# Patient Record
Sex: Male | Born: 1945 | Race: White | Hispanic: No | Marital: Married | Smoking: Former smoker
Health system: Southern US, Community
[De-identification: ages and names within clinical notes are randomized; demographics above are authoritative.]

## PROBLEM LIST (undated history)

## (undated) DIAGNOSIS — T884XXA Failed or difficult intubation, initial encounter: Secondary | ICD-10-CM

## (undated) DIAGNOSIS — Z87898 Personal history of other specified conditions: Secondary | ICD-10-CM

## (undated) DIAGNOSIS — N2889 Other specified disorders of kidney and ureter: Secondary | ICD-10-CM

## (undated) DIAGNOSIS — Z87442 Personal history of urinary calculi: Secondary | ICD-10-CM

## (undated) DIAGNOSIS — T8859XA Other complications of anesthesia, initial encounter: Secondary | ICD-10-CM

## (undated) DIAGNOSIS — M1990 Unspecified osteoarthritis, unspecified site: Secondary | ICD-10-CM

## (undated) DIAGNOSIS — D759 Disease of blood and blood-forming organs, unspecified: Secondary | ICD-10-CM

## (undated) DIAGNOSIS — R519 Headache, unspecified: Secondary | ICD-10-CM

## (undated) DIAGNOSIS — E039 Hypothyroidism, unspecified: Secondary | ICD-10-CM

## (undated) DIAGNOSIS — F419 Anxiety disorder, unspecified: Secondary | ICD-10-CM

## (undated) DIAGNOSIS — G4730 Sleep apnea, unspecified: Secondary | ICD-10-CM

## (undated) DIAGNOSIS — K5790 Diverticulosis of intestine, part unspecified, without perforation or abscess without bleeding: Secondary | ICD-10-CM

## (undated) DIAGNOSIS — C61 Malignant neoplasm of prostate: Secondary | ICD-10-CM

## (undated) DIAGNOSIS — T4145XA Adverse effect of unspecified anesthetic, initial encounter: Secondary | ICD-10-CM

## (undated) DIAGNOSIS — L121 Cicatricial pemphigoid: Secondary | ICD-10-CM

## (undated) DIAGNOSIS — J45909 Unspecified asthma, uncomplicated: Secondary | ICD-10-CM

## (undated) DIAGNOSIS — F319 Bipolar disorder, unspecified: Secondary | ICD-10-CM

## (undated) HISTORY — PX: EYE SURGERY: SHX253

## (undated) HISTORY — PX: TONSILLECTOMY: SUR1361

## (undated) HISTORY — PX: OTHER SURGICAL HISTORY: SHX169

## (undated) HISTORY — PX: NECK SURGERY: SHX720

## (undated) HISTORY — PX: HERNIA REPAIR: SHX51

## (undated) HISTORY — PX: WISDOM TOOTH EXTRACTION: SHX21

---

## 2002-05-15 ENCOUNTER — Encounter: Payer: Self-pay | Admitting: Family Medicine

## 2002-05-15 ENCOUNTER — Ambulatory Visit (HOSPITAL_COMMUNITY): Admission: RE | Admit: 2002-05-15 | Discharge: 2002-05-15 | Payer: Self-pay | Admitting: Family Medicine

## 2002-09-13 ENCOUNTER — Ambulatory Visit (HOSPITAL_BASED_OUTPATIENT_CLINIC_OR_DEPARTMENT_OTHER): Admission: RE | Admit: 2002-09-13 | Discharge: 2002-09-13 | Payer: Self-pay | Admitting: *Deleted

## 2003-05-23 ENCOUNTER — Ambulatory Visit (HOSPITAL_BASED_OUTPATIENT_CLINIC_OR_DEPARTMENT_OTHER): Admission: RE | Admit: 2003-05-23 | Discharge: 2003-05-23 | Payer: Self-pay | Admitting: General Surgery

## 2003-05-23 ENCOUNTER — Ambulatory Visit (HOSPITAL_COMMUNITY): Admission: RE | Admit: 2003-05-23 | Discharge: 2003-05-23 | Payer: Self-pay | Admitting: General Surgery

## 2003-08-18 ENCOUNTER — Ambulatory Visit (HOSPITAL_COMMUNITY): Admission: RE | Admit: 2003-08-18 | Discharge: 2003-08-19 | Payer: Self-pay | Admitting: Neurosurgery

## 2003-08-18 HISTORY — PX: NECK SURGERY: SHX720

## 2003-08-31 ENCOUNTER — Ambulatory Visit (HOSPITAL_COMMUNITY): Admission: RE | Admit: 2003-08-31 | Discharge: 2003-08-31 | Payer: Self-pay | Admitting: Oncology

## 2006-02-25 ENCOUNTER — Ambulatory Visit (HOSPITAL_COMMUNITY): Admission: RE | Admit: 2006-02-25 | Discharge: 2006-02-26 | Payer: Self-pay | Admitting: Otolaryngology

## 2007-02-23 ENCOUNTER — Ambulatory Visit (HOSPITAL_COMMUNITY): Admission: RE | Admit: 2007-02-23 | Discharge: 2007-02-23 | Payer: Self-pay | Admitting: Specialist

## 2009-04-11 ENCOUNTER — Ambulatory Visit: Admission: RE | Admit: 2009-04-11 | Discharge: 2009-05-09 | Payer: Self-pay | Admitting: Radiation Oncology

## 2009-04-23 ENCOUNTER — Encounter: Admission: RE | Admit: 2009-04-23 | Discharge: 2009-04-23 | Payer: Self-pay | Admitting: Urology

## 2009-05-12 DIAGNOSIS — C61 Malignant neoplasm of prostate: Secondary | ICD-10-CM

## 2009-05-12 HISTORY — DX: Malignant neoplasm of prostate: C61

## 2009-05-28 ENCOUNTER — Ambulatory Visit (HOSPITAL_BASED_OUTPATIENT_CLINIC_OR_DEPARTMENT_OTHER): Admission: RE | Admit: 2009-05-28 | Discharge: 2009-05-28 | Payer: Self-pay | Admitting: Urology

## 2009-06-15 ENCOUNTER — Ambulatory Visit: Admission: RE | Admit: 2009-06-15 | Discharge: 2009-09-13 | Payer: Self-pay | Admitting: Radiation Oncology

## 2009-09-18 ENCOUNTER — Ambulatory Visit: Admission: RE | Admit: 2009-09-18 | Discharge: 2009-09-23 | Payer: Self-pay | Admitting: Radiation Oncology

## 2010-02-18 ENCOUNTER — Encounter: Admission: RE | Admit: 2010-02-18 | Discharge: 2010-02-18 | Payer: Self-pay | Admitting: General Surgery

## 2010-07-28 LAB — CBC
HCT: 48.7 % (ref 39.0–52.0)
Hemoglobin: 16.2 g/dL (ref 13.0–17.0)
MCHC: 33.2 g/dL (ref 30.0–36.0)
MCV: 93.8 fL (ref 78.0–100.0)
Platelets: 287 10*3/uL (ref 150–400)
RBC: 5.2 MIL/uL (ref 4.22–5.81)
RDW: 14.3 % (ref 11.5–15.5)
WBC: 7.5 10*3/uL (ref 4.0–10.5)

## 2010-07-28 LAB — APTT: aPTT: 35 seconds (ref 24–37)

## 2010-07-28 LAB — COMPREHENSIVE METABOLIC PANEL
ALT: 26 U/L (ref 0–53)
AST: 24 U/L (ref 0–37)
Albumin: 3.8 g/dL (ref 3.5–5.2)
Alkaline Phosphatase: 80 U/L (ref 39–117)
BUN: 12 mg/dL (ref 6–23)
CO2: 27 mEq/L (ref 19–32)
Calcium: 9.3 mg/dL (ref 8.4–10.5)
Chloride: 107 mEq/L (ref 96–112)
Creatinine, Ser: 0.97 mg/dL (ref 0.4–1.5)
GFR calc Af Amer: >60 mL/min (ref >60)
GFR calc non Af Amer: >60 mL/min (ref >60)
Glucose, Bld: 95 mg/dL (ref 70–99)
Potassium: 4.2 mEq/L (ref 3.5–5.1)
Sodium: 139 mEq/L (ref 135–145)
Total Bilirubin: 0.7 mg/dL (ref 0.3–1.2)
Total Protein: 6.5 g/dL (ref 6.0–8.3)

## 2010-07-28 LAB — PROTIME-INR
INR: 0.99 (ref 0.00–1.49)
Prothrombin Time: 13 seconds (ref 11.6–15.2)

## 2010-09-24 NOTE — Procedures (Signed)
EEG NUMBER:  [id number].   HISTORY:  This is a 65 year old with episodes of visual and auditory  hallucinations.  The patient is having an EEG done to evaluate for  seizure activity.   REFERRING PHYSICIAN:  Dr. Neale Burly   PROCEDURE:  Routine EEG.   TECHNICAL DESCRIPTION:  Throughout this routine EEG there is a posterior  dominant rhythm of 9-10 Hz activity at 10-20 microvolts.  The background  activity is symmetric, mostly comprised of alpha range activity at 10-25  microvolts.   With photic stimulation there is mild symmetric photic driving response  noted.   Hyperventilation does not produce any significant abnormalities.   The patient does not go to sleep during this recording.  Throughout this  record there is no evidence of electrographic seizures or interictal  discharge activity.   The EKG tracing shows a heart rhythm around 70 beats per minute   IMPRESSION:  This routine EEG is within normal limits in the awake  state.      Bevelyn Buckles. Nash Shearer, M.D.  Electronically Signed     ZOX:WRUE  D:  02/23/2007 12:11:10  T:  02/24/2007 08:48:13  Job #:  [id number]   cc:   Santiago Glad  Fax: [phone]

## 2010-09-27 NOTE — Discharge Summary (Signed)
NAME:  Eric Crosby, BRADDY NO.:  [id number]   MEDICAL RECORD NO.:  [id number]          PATIENT TYPE:  OIB   LOCATION:  2550                         FACILITY:  [facility]   PHYSICIAN:  Hermelinda Medicus, M.D.   DATE OF BIRTH:  [birth year]   DATE OF ADMISSION:  02/25/2006  DATE OF DISCHARGE:  02/26/2006                                 DISCHARGE SUMMARY   HISTORY:  This patient is a 65 year old male who has had considerable  snoring issues.  His sleep numbers showed an RDI of 7.4, lowest O2 of 84%.  He, however, is having considerable snoring.  He had some nasal trauma and  has considerable nasal obstruction with a severe septal deviation.  He now  enters for a septal reconstruction, turbinate reduction, and a laser-  assisted uvulopalatoplasty.   PAST MEDICAL HISTORY:  Unremarkable.   ALLERGIES:  SULFA.   SOCIAL HISTORY:  He drinks very occasionally, one time a month, and still  smokes.  He smoked for 20 years, but stopped in 1987.  He states he smoked  up to 3 packs a day at one time.   PAST SURGICAL HISTORY:  T and A as a child.  He had a hernia repair in 2005.  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   PHYSICAL EXAMINATION:  VITAL SIGNS:  Remarkable for blood pressure of  108/77, height 73 inches, he weighs 101 kg, his heart rate is 74.  HEENT:  Tympanic membranes are clear.  Severe septal deviation to the right  and his turbinate is very large, blocking his nose.  His oral cavity is also  small and narrow.  His pallet is low and uvula is large.  Free of any  thyromegaly, cervical adenopathy, or mass.  The larynx is clear.  True  cords, false cords, epiglottis, and base of tongue are clear.  True cord  mobility, gag reflex, tongue mobility, EOMs, facial nerves are all  symmetrical.  CHEST:  Clear.  No rales, rhonchi, or wheezes.  CARDIOVASCULAR:  Unremarkable.  His cardiology evaluation shows a  normal  EKG.   STATUS:  Excellent.  His health history is excellent.   INITIAL DIAGNOSES:  1. Snoring with septal deviation, turbinate hypertrophy, nasal      obstruction.  2. History of kidney stones.  3. History of gastroesophageal reflux disease, controlled.  4. History of smoking.  5. History of eyelid surgery.  6. History of hernia repair.  7. History of tonsillectomy.  8. SULFA ALLERGY.   The patient has done very well and is aware the he cannot eat aggressively.  He needs to be on a soft diet, a bland diet, and will follow up on Monday.  To see me in the office at 1 week, 3 weeks, 6 weeks, 3 months, 6 months,  then a year.           ______________________________  Hermelinda Medicus, M.D.     JC/MEDQ  D:  02/26/2006  T:  02/27/2006  Job:  [id number]  cc:   [facility]

## 2010-09-27 NOTE — H&P (Signed)
NAME:  Eric Crosby, Eric Crosby NO.:  [id number]   MEDICAL RECORD NO.:  [id number]          PATIENT TYPE:  AMB   LOCATION:  SDS                          FACILITY:  [facility]   PHYSICIAN:  Hermelinda Medicus, M.D.   DATE OF BIRTH:  08-31-1945   DATE OF ADMISSION:  02/25/2006  DATE OF DISCHARGE:                                HISTORY & PHYSICAL   23-HOUR OBSERVATION:   This patient is a 65 year old male who was originally seen by Dr. Sherlyn Lick,  who is no longer here in [place], in 2004.  He was told he had sleep  apnea and did have an O2 of 84%, had an RDI of 7.4, and then he spent 24% of  his time in a deep sleep.  He was a severe snorer, however.  He showed no  cardiac arrhythmias and was told he had a septal deviation and turbinate  hypertrophy.  However, he has never done anything about this.  He has  continued to have problems with the  snoring and with that, a medical workup  was completed and he is now here for septal reconstruction, turbinate  reduction, LAUP.  His EKG is normal sinus rhythm.  He has a history of  smoking but none at present.  Lungs are clear.  He has a well-controlled  gastroesophageal reflux and also has a history of kidney stones but takes no  medications for this.  He, however, has had a T&A as a child.  He has had a  hernia repair in 2005, neck surgery in 2003, and he has had several  surgeries on his eyelids, where he now is no longer having as much of a  problem with this but has a slight amount of difficulty in closing his eyes.   He takes no medications, but his red cell count is apparently slightly  elevated.   PHYSICAL EXAMINATION:  VITAL SIGNS:  His blood pressure was 108/77 and pulse  of 74, heart rate 74, and respirations 16.  He weighs 101 kg and he is 73  inches.  HEENT:  His ears are clear, tympanic membranes are clear.  The oral cavity  shows a low palate and uvula, a large uvula, and the septum is grossly  deviated to the right with  turbinate hypertrophy.  NECK:  Free of any thyromegaly, cervical adenopathy or mass.  CHEST:  Clear.  No rales, rhonchi or wheezes.  CARDIOVASCULAR:  No __________, murmurs or gallops.  ABDOMEN:  Unremarkable.  EXTREMITIES:  Unremarkable.   His eyes show the previous surgery but vision is good, and the patient now  enters for a septal reconstruction and turbinate reduction with a laser  assisted uvulopalatoplasty.  His further diagnoses:   1. Tonsillectomy and adenoidectomy as a child.  2. Hernia repair.  3. Neck surgery in 2003.  4. Eyelid surgery in the past 2 years.  5. Snoring with a septal deviation.           ______________________________  Hermelinda Medicus, M.D.     JC/MEDQ  D:  02/25/2006  T:  02/26/2006  Job:  [id number]  cc:   [facility]

## 2010-09-27 NOTE — Op Note (Signed)
NAME:  Eric Crosby, Eric Crosby NO.:  [id number]   MEDICAL RECORD NO.:  [id number]          PATIENT TYPE:  AMB   LOCATION:  SDS                          FACILITY:  [facility]   PHYSICIAN:  Hermelinda Medicus, M.D.   DATE OF BIRTH:  [birth year]   DATE OF PROCEDURE:  02/25/2006  DATE OF DISCHARGE:                                 OPERATIVE REPORT   PREOPERATIVE DIAGNOSES:  1. Septal deviation.  2. 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   POSTOPERATIVE DIAGNOSES:  1. Septal deviation.  2. 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   OPERATION:  1. Septal reconstruction.  2. Turbinate reduction.  3. Laser-assisted uvulopalatoplasty.   SURGEON:  Hermelinda Medicus, M.D.   ANESTHESIA:  With Dr. Jairo Ben with anesthesia of local MAC, 1%  Xylocaine with epinephrine 6 mL, topical cocaine 200 mg.   The patient is aware the risks and gains of possible bleeding and possible  continuing snoring even after his surgery.  He also could have an infection  or hemorrhage, and he is aware of these issues; however, he is trying to  gain some reasonable nasal airway and to snore less.   PROCEDURE:  Patient placed in the supine position.  Under local anesthesia,  1% Xylocaine with epinephrine and topical cocaine, the hemitransfixion  incision was made after the patient was prepped and draped.  The  mucoperichondrium was carried back on his right side and open and closed  Jansen-Middletons were then used to remove an area of quadrilateral  cartilage and ethmoid and vomerine septal deviation.  The 4-mm chisel was  also used to take this down and to set the septum back on the midline,  preseptal midline.  Once the septum was established in the midline, closure  was begun using the 5-0 plain catgut and a through-and-through septal suture  x2 using 4-0.  The inferior turbinates were  aggressively outfractured, and  we gained considerable space.  We also used the Elmed to shrink the  membranes in the inferior lateral portion of the inferior turbinates.  This  was set at 12.  Once this was completed and closure was completed and all  hemostasis was established, attention was then carried to the oral cavity,  where Cetacaine was used as initial anesthesia and then 1% Xylocaine was  injected into the areas of planned incisions and then the laser set at 10  continuous watts were used for 3 minutes to place our lateral and superior  incisions and also to decrease the uvula size to its normal status, bringing  it down to about one-half of what it as.  When this was completed, all  hemostasis was checked.  The nasopharynx was then again checked.  Anesthesia  trumpets were then placed for his first postoperative course and will be  removed this afternoon.  The patient tolerated the procedure very well and  is doing well postoperatively.  He will be kept on 23-hour observation and  then we  will see him again in 5 days, then 10 days, 3 weeks, 6 weeks, 3  months, 6 months and a year.           ______________________________  Hermelinda Medicus, M.D.     JC/MEDQ  D:  02/25/2006  T:  02/26/2006  Job:  [id number]   cc:   [facility] [place]

## 2010-09-27 NOTE — Op Note (Signed)
NAME:  Eric Crosby, Eric Crosby                           ACCOUNT NO.:  [id number]   MEDICAL RECORD NO.:  [id number]                   PATIENT TYPE:  AMB   LOCATION:  DSC                                  FACILITY:  [facility]   PHYSICIAN:  Sharlet Salina T. Hoxworth, M.D.          DATE OF BIRTH:  1945/12/18   DATE OF PROCEDURE:  05/23/2003  DATE OF DISCHARGE:                                 OPERATIVE REPORT   PREOPERATIVE DIAGNOSIS:  Left inguinal hernia.   POSTOPERATIVE DIAGNOSIS:  Left inguinal hernia.   SURGICAL PROCEDURE:  Repair of left inguinal hernia.   SURGEON:  Lorne Skeens. Hoxworth, M.D.   ANESTHESIA:  Local with IV sedation.   BRIEF HISTORY:  Mr. Ewing is a 65 year old male who presents with a  gradually enlarging bulge in the left groin, and exam confirms a moderate-  sized reducible left inguinal hernia.  Repair under local anesthesia with  sedation using mesh has been recommended after discussing various options.  The nature of the procedure, indications, and risks of bleeding, infection,  and recurrence were discussed and understood.  He is now brought to the  operating room for this procedure.   DESCRIPTION OF OPERATION:  The patient was brought to the operating room,  placed in the supine position on the operating table, and IV sedation was  administered.  He received preoperative antibiotics.  The left groin was  sterilely prepped and draped.  Local anesthesia was used to block the  ilioinguinal nerve and infiltrate the area of the incision.  An oblique  incision was made in the left groin and dissection carried down through the  subcutaneous tissue.  Crossing veins were doubly clamped, divided, and  ligated with 3-0 Vicryl.  The external oblique was identified, anesthetized,  divided along the lines of its fibers through the external ring.  The cord,  rectus sheath, and pubic tubercle and inguinal ligament were all  anesthetized.  The ilioinguinal nerve was identified and  protected  throughout the remainder of the dissection.  The cord was dissected up off  the floor with the pubic tubercle.  Cremasteric fibers were divided  bilaterally up to the internal ring.  There was a large indirect sac that  was dissected free from cord structures using cautery and blunt dissection  up to the level of the internal ring.  At this point the sac was clamped,  divided, and suture ligated with 2-0 silk.  There was also a large cord  lipoma associated with the cord structures which was dissected free using  cautery and clamped at the internal ring, divided, and tied with 2-0 silk.  The floor was relatively intact.  A piece of Parietex polyester mesh was  trimmed to size to fit the floor of the inguinal canal with the tails going  around the cord of the internal ring.  It was sutured next to the pubic  tubercle and then to the  inguinal ligament and iliopubic tract medial to  lateral with running 2-0 Prolene.  Medially the mesh was sutured to the  heads of the rectus sheath with interrupted 2-0 Prolene.  The tails were  then tacked together lateral to the cord with interrupted 2-0 Prolene  creating a new internal ring snugged to a fingertip.  This appeared to  provide nice broad coverage to the direct and indirect spaces.  The cord and  ilioinguinal nerves were returned to the anatomic position.  The external  oblique was closed over this with a running 3-0 Vicryl.  Scarpa fascia was  closed  with running 3-0 Vicryl.  The skin was closed with running subcuticular 4-0  Monocryl and Steri-Strips.  Sponge, needle, and instrument counts were  correct.  Dry sterile dressings were applied, and the patient was taken to  recovery in good condition.                                               Lorne Skeens. Hoxworth, M.D.    Tory Emerald  D:  05/23/2003  T:  05/23/2003  Job:  [id number]

## 2010-09-27 NOTE — Op Note (Signed)
NAME:  Eric Crosby, Eric Crosby                           ACCOUNT NO.:  [id number]   MEDICAL RECORD NO.:  [id number]                   PATIENT TYPE:  OIB   LOCATION:  3011                                 FACILITY:  [facility]   PHYSICIAN:  Danae Orleans. Venetia Maxon, M.D.               DATE OF BIRTH:  28-Jul-1945   DATE OF PROCEDURE:  08/18/2003  DATE OF DISCHARGE:  08/19/2003                                 OPERATIVE REPORT   PREOPERATIVE DIAGNOSIS:  Herniated cervical disk with cervical spondylosis,  degenerative disk disease, and cervical radiculopathy, C5, C6 and C6, C7  levels.   POSTOPERATIVE DIAGNOSIS:  Herniated cervical disk with cervical spondylosis,  degenerative disk disease, and cervical radiculopathy,   PROCEDURE:  Anterior cervical decompression and fusion, C5, C6 and C6, C7  levels with allograft, bone graft, and anterior cervical plate.   SURGEON:  Danae Orleans. Venetia Maxon, M.D.   ASSISTANT:  Cristi Loron, M.D.   ANESTHESIA:  General endotracheal anesthesia.   ESTIMATED BLOOD LOSS:  Minimal.   COMPLICATIONS:  None.   DISPOSITION:  Recovery.   INDICATION:  Eric Crosby is a 65 year old man with severe left arm pain and  weakness with spondylosis and disk herniation at C5, C6 level and also C6,  C7 on the left.  We have elected to take him to surgery for anterior  cervical decompression and fusion at these affected levels.   PROCEDURE:  Eric Crosby was brought to the operating room.  Following a  satisfactory and uncomplicated induction of general endotracheal anesthesia  and placement of intravenous lines, the patient was placed in the supine  position on the operative table.  His neck was placed in slight extension.  He was placed in 10 pounds of halter traction.  His anterior neck was then  prepped and draped in the usual sterile fashion.  The area of planned  incision was infiltrated with 0.25% Marcaine and 0.5% lidocaine, 1:200,000  epinephrine.  An incision was made from the midline  to the anterior border  of the sternocleidomastoid muscle at what was felt to be the C6 level, and  this was carried through the platysmal layer sharply.  A sub-platysmal  elliptical dissection was performed exposing the anterior border of the  sternocleidomastoid muscle using blunt dissection.  The carotid sheath was  kept lateral and trachea and esophagus were kept medial, and the anterior  cervical spine was identified.  A bent spinal needle was placed at the C5,  C6 level, and intraoperative x-ray confirmed this to be the C5, C6 level.  Subsequently, using blunt dissection, the anterior cervical spine was  further exposed in using electrocautery.  The longus colli muscles were  taken down from C5 through C7 bilaterally along with Key elevator.  The  shadow line retractor was placed along with up-down retractors to facilitate  exposure.  The C5, C6 level was incised, and disk material was removed  in a  piecemeal fashion.  There was a significant bone spur at the C6, C7 level,  and this was removed with a Lexxel rongeur.  The C6, C7 level was then  incised with a 15 blade, and disk material was removed in a piecemeal  fashion.  Using a variety of Carlens curettes, the endplates were stripped  of residual disk material, and the disk spreader was placed at the C6, C7  level.  The microscope was brought onto the field, and using high-powered  microscopic visualization, the endplates of C6 and C7 were decorticated, and  uncinate spurs drilled down.  Subsequently, the posterior longitudinal  ligament which was highly degenerated was removed decompressing the central  spinal cord dura.  Both C7 nerves roots are decompressed as they extended  out the neural foramina.  Hemostasis was assured with Gelfoam soaked in  thrombin.  Then after a trial sizing, nn 8 mm machined cortical bone graft  was packed with morselized bone drillings from the endplates and placed in  the interspace and countersunk  appropriately.  Attention was then turned to  the C5, C6 level where a similar decompression was performed.  Both C6 nerve  roots were decompressed.  There was a significant amount of disk herniation  on the left abutting the C6 nerve root, and this was decompressed as it  extended at the neural foramen.  Again, hemostasis was assured.  An 8 mm  machine cortical bone graft was inserted in this interspace and countersunk  appropriately.  The traction weight was removed. The microscope was taken  out of the field.  A 37 mm Alphatec anterior cervical plate was then affixed  to the anterior cervical spine using viable-angle 14 mm screws, 2 at C5, 2  at C6, and 2 at the C7 levels.  All screws had excellent purchase except for  the right inferior C7 screw, and this was replaced with a 4.5 mm screw which  had good purchase.  The locking mechanisms were engaged.  Final x-ray  demonstrated well-positioned bone grafts in the anterior cervical plate at  correct levels.  The wound was then copiously irrigated with bacitracin and  saline.  The soft tissues were inspected and found to be in good repair.  Hemostasis was assured.  The wound at the platysmal layer with 3-0 Vicryl  sutures, and the skin edges were reapproximated with a running 4-0 Vicryl  subcuticular stitch.  The wound was dressed with Dermabond.  The patient was  extubated in the operating room and taken to the recovery room in stable and  satisfactory condition having tolerated his operation well.  Counts were  correct at the end of the case.                                               Danae Orleans. Venetia Maxon, M.D.    JDS/MEDQ  D:  08/18/2003  T:  08/20/2003  Job:  [id number]

## 2013-12-08 ENCOUNTER — Emergency Department (HOSPITAL_BASED_OUTPATIENT_CLINIC_OR_DEPARTMENT_OTHER): Payer: Medicare HMO

## 2013-12-08 ENCOUNTER — Emergency Department (HOSPITAL_BASED_OUTPATIENT_CLINIC_OR_DEPARTMENT_OTHER)
Admission: EM | Admit: 2013-12-08 | Discharge: 2013-12-08 | Disposition: A | Discharge status: Home / Self Care | Payer: Medicare HMO | Attending: Emergency Medicine | Admitting: Emergency Medicine

## 2013-12-08 ENCOUNTER — Encounter (HOSPITAL_BASED_OUTPATIENT_CLINIC_OR_DEPARTMENT_OTHER): Payer: Self-pay | Admitting: Emergency Medicine

## 2013-12-08 DIAGNOSIS — N2889 Other specified disorders of kidney and ureter: Secondary | ICD-10-CM

## 2013-12-08 DIAGNOSIS — Z79899 Other long term (current) drug therapy: Secondary | ICD-10-CM | POA: Diagnosis not present

## 2013-12-08 DIAGNOSIS — Z8719 Personal history of other diseases of the digestive system: Secondary | ICD-10-CM | POA: Insufficient documentation

## 2013-12-08 DIAGNOSIS — K002 Abnormalities of size and form of teeth: Secondary | ICD-10-CM | POA: Diagnosis not present

## 2013-12-08 DIAGNOSIS — Z87891 Personal history of nicotine dependence: Secondary | ICD-10-CM | POA: Insufficient documentation

## 2013-12-08 DIAGNOSIS — Z8546 Personal history of malignant neoplasm of prostate: Secondary | ICD-10-CM | POA: Insufficient documentation

## 2013-12-08 DIAGNOSIS — K529 Noninfective gastroenteritis and colitis, unspecified: Secondary | ICD-10-CM

## 2013-12-08 DIAGNOSIS — R197 Diarrhea, unspecified: Secondary | ICD-10-CM | POA: Insufficient documentation

## 2013-12-08 DIAGNOSIS — N289 Disorder of kidney and ureter, unspecified: Secondary | ICD-10-CM | POA: Insufficient documentation

## 2013-12-08 DIAGNOSIS — Z8669 Personal history of other diseases of the nervous system and sense organs: Secondary | ICD-10-CM | POA: Insufficient documentation

## 2013-12-08 DIAGNOSIS — K5289 Other specified noninfective gastroenteritis and colitis: Secondary | ICD-10-CM | POA: Insufficient documentation

## 2013-12-08 HISTORY — DX: Malignant neoplasm of prostate: C61

## 2013-12-08 HISTORY — DX: Cicatricial pemphigoid: L12.1

## 2013-12-08 HISTORY — DX: Diverticulosis of intestine, part unspecified, without perforation or abscess without bleeding: K57.90

## 2013-12-08 LAB — URINE MICROSCOPIC-ADD ON

## 2013-12-08 LAB — URINALYSIS, ROUTINE W REFLEX MICROSCOPIC
Glucose, UA: NEGATIVE mg/dL
Hgb urine dipstick: NEGATIVE
Ketones, ur: 15 mg/dL — AB
Nitrite: NEGATIVE
Protein, ur: 100 mg/dL — AB
Specific Gravity, Urine: 1.038 — ABNORMAL HIGH (ref 1.005–1.030)
Urobilinogen, UA: 0.2 mg/dL (ref 0.0–1.0)
pH: 6 (ref 5.0–8.0)

## 2013-12-08 LAB — CBC WITH DIFFERENTIAL/PLATELET
Basophils Absolute: 0 10*3/uL (ref 0.0–0.1)
Basophils Relative: 0 % (ref 0–1)
Eosinophils Absolute: 0 10*3/uL (ref 0.0–0.7)
Eosinophils Relative: 0 % (ref 0–5)
HCT: 47.9 % (ref 39.0–52.0)
Hemoglobin: 16.9 g/dL (ref 13.0–17.0)
Lymphocytes Relative: 9 % — ABNORMAL LOW (ref 12–46)
Lymphs Abs: 1.2 10*3/uL (ref 0.7–4.0)
MCH: 31.4 pg (ref 26.0–34.0)
MCHC: 35.3 g/dL (ref 30.0–36.0)
MCV: 88.9 fL (ref 78.0–100.0)
Monocytes Absolute: 0.9 10*3/uL (ref 0.1–1.0)
Monocytes Relative: 7 % (ref 3–12)
Neutro Abs: 11.4 10*3/uL — ABNORMAL HIGH (ref 1.7–7.7)
Neutrophils Relative %: 84 % — ABNORMAL HIGH (ref 43–77)
Platelets: 187 10*3/uL (ref 150–400)
RBC: 5.39 MIL/uL (ref 4.22–5.81)
RDW: 14.8 % (ref 11.5–15.5)
WBC: 13.5 10*3/uL — ABNORMAL HIGH (ref 4.0–10.5)

## 2013-12-08 LAB — COMPREHENSIVE METABOLIC PANEL
ALT: 24 U/L (ref 0–53)
AST: 30 U/L (ref 0–37)
Albumin: 3.5 g/dL (ref 3.5–5.2)
Alkaline Phosphatase: 87 U/L (ref 39–117)
Anion gap: 17 — ABNORMAL HIGH (ref 5–15)
BUN: 20 mg/dL (ref 6–23)
CO2: 20 mEq/L (ref 19–32)
Calcium: 9.5 mg/dL (ref 8.4–10.5)
Chloride: 102 mEq/L (ref 96–112)
Creatinine, Ser: 1.2 mg/dL (ref 0.50–1.35)
GFR calc Af Amer: 71 mL/min — ABNORMAL LOW (ref >90)
GFR calc non Af Amer: 61 mL/min — ABNORMAL LOW (ref >90)
Glucose, Bld: 126 mg/dL — ABNORMAL HIGH (ref 70–99)
Potassium: 3.3 mEq/L — ABNORMAL LOW (ref 3.7–5.3)
Sodium: 139 mEq/L (ref 137–147)
Total Bilirubin: 0.6 mg/dL (ref 0.3–1.2)
Total Protein: 7.2 g/dL (ref 6.0–8.3)

## 2013-12-08 LAB — LIPASE, BLOOD: Lipase: 31 U/L (ref 11–59)

## 2013-12-08 MED ORDER — OXYCODONE-ACETAMINOPHEN 5-325 MG PO TABS: 2.0000 | ORAL_TABLET | ORAL | Status: DC | PRN

## 2013-12-08 MED ORDER — ONDANSETRON 4 MG PO TBDP: 4.0000 mg | ORAL_TABLET | Freq: Three times a day (TID) | ORAL | Status: DC | PRN

## 2013-12-08 MED ORDER — IOHEXOL 300 MG/ML  SOLN
100.0000 mL | Freq: Once | INTRAMUSCULAR | Status: AC | PRN
  Administered 2013-12-08: 100 mL via INTRAVENOUS

## 2013-12-08 MED ORDER — CIPROFLOXACIN HCL 500 MG PO TABS: 500.0000 mg | ORAL_TABLET | Freq: Two times a day (BID) | ORAL | Status: DC

## 2013-12-08 MED ORDER — IOHEXOL 300 MG/ML  SOLN
50.0000 mL | Freq: Once | INTRAMUSCULAR | Status: AC | PRN
  Administered 2013-12-08: 50 mL via ORAL

## 2013-12-08 MED ORDER — METRONIDAZOLE 500 MG PO TABS: 500.0000 mg | ORAL_TABLET | Freq: Two times a day (BID) | ORAL | Status: DC

## 2013-12-08 NOTE — ED Notes (Signed)
Patient transported to CT 

## 2013-12-08 NOTE — Discharge Instructions (Signed)

## 2013-12-08 NOTE — ED Notes (Signed)
Returned from radiology. 

## 2013-12-08 NOTE — ED Provider Notes (Signed)
CSN: [id number]     Arrival date & time 12/08/13  1243 History   First MD Initiated Contact with Patient 12/08/13 1311     Chief Complaint  Patient presents with  . Diarrhea     (Consider location/radiation/quality/duration/timing/severity/associated sxs/prior Treatment) Patient is a 68 y.o. male presenting with diarrhea. The history is provided by the patient. No language interpreter was used.  Diarrhea Quality:  Watery Severity:  Severe Onset quality:  Gradual Number of episodes:  Multiple Timing:  Constant Progression:  Worsening Relieved by:  Nothing Worsened by:  Nothing tried Ineffective treatments:  None tried Associated symptoms: abdominal pain   Risk factors: no recent antibiotic use   Pt has a history of diverticulosis on colonoscopy a year ago.   Pt complains of abdominal pain and diarrhea since Monday  Past Medical History  Diagnosis Date  . Diverticulosis   . OCP (ocular cicatricial pemphigoid)   . Prostate cancer 2011   Past Surgical History  Procedure Laterality Date  . Colonoscopy    . Eyelid surgery    . Hernia repair    . Neck surgery     No family history on file. History  Substance Use Topics  . Smoking status: Former Research scientist (life sciences)  . Smokeless tobacco: Never Used  . Alcohol Use: No    Review of Systems  Gastrointestinal: Positive for abdominal pain and diarrhea.  All other systems reviewed and are negative.     Allergies  Sulfa antibiotics  Home Medications   Prior to Admission medications   Medication Sig Start Date End Date Taking? Authorizing Provider  ALPRAZolam Duanne Moron) 0.25 MG tablet Take 0.25 mg by mouth at bedtime as needed for anxiety.   Yes Historical Provider, MD  Fish Oil-Cholecalciferol (FISH OIL + D3 PO) Take by mouth.   Yes Historical Provider, MD  Ibuprofen (ADVIL) 200 MG CAPS Take 600 mg by mouth.   Yes Historical Provider, MD  levothyroxine (SYNTHROID, LEVOTHROID) 112 MCG tablet Take 112 mcg by mouth daily before breakfast.    Yes Historical Provider, MD  Multiple Vitamins-Minerals (MULTIVITAMIN & MINERAL PO) Take by mouth.   Yes Historical Provider, MD  omeprazole (PRILOSEC) 20 MG capsule Take 20 mg by mouth daily.   Yes Historical Provider, MD  ST JOHNS WORT PO Take by mouth.   Yes Historical Provider, MD   BP 133/80  Pulse 103  Temp(Src) 98.3 F (36.8 C) (Oral)  Resp 22  SpO2 99% Physical Exam  Nursing note and vitals reviewed. Constitutional: He is oriented to person, place, and time. He appears well-developed and well-nourished.  HENT:  Head: Normocephalic and atraumatic.  Right Ear: External ear normal.  Eyes: Conjunctivae and EOM are normal. Pupils are equal, round, and reactive to light.  Neck: Normal range of motion. Neck supple.  Cardiovascular: Normal rate and normal heart sounds.   Pulmonary/Chest: Effort normal and breath sounds normal.  Abdominal: Soft. He exhibits distension. There is tenderness.  Musculoskeletal: He exhibits tenderness.  Neurological: He is alert and oriented to person, place, and time. He has normal reflexes.  Skin: Skin is warm.  Psychiatric: He has a normal mood and affect.    ED Course  Procedures (including critical care time) Labs Review Labs Reviewed  CBC WITH DIFFERENTIAL - Abnormal; Notable for the following:    WBC 13.5 (*)    Neutrophils Relative % 84 (*)    Neutro Abs 11.4 (*)    Lymphocytes Relative 9 (*)    All other components within normal  limits  COMPREHENSIVE METABOLIC PANEL - Abnormal; Notable for the following:    Potassium 3.3 (*)    Glucose, Bld 126 (*)    GFR calc non Af Amer 61 (*)    GFR calc Af Amer 71 (*)    Anion gap 17 (*)    All other components within normal limits  URINALYSIS, ROUTINE W REFLEX MICROSCOPIC - Abnormal; Notable for the following:    Color, Urine ORANGE (*)    APPearance CLOUDY (*)    Specific Gravity, Urine 1.038 (*)    Bilirubin Urine SMALL (*)    Ketones, ur 15 (*)    Protein, ur 100 (*)    Leukocytes, UA  TRACE (*)    All other components within normal limits  URINE MICROSCOPIC-ADD ON - Abnormal; Notable for the following:    Squamous Epithelial / LPF FEW (*)    Bacteria, UA MANY (*)    Casts HYALINE CASTS (*)    All other components within normal limits  LIPASE, BLOOD    Imaging Review Ct Abdomen Pelvis W Contrast  12/08/2013   CLINICAL DATA:  Abdominal pain, fever and diarrhea. History of prostate cancer and hernia repair.  EXAM: CT ABDOMEN AND PELVIS WITH CONTRAST  TECHNIQUE: Multidetector CT imaging of the abdomen and pelvis was performed using the standard protocol following bolus administration of intravenous contrast.  CONTRAST:  124mL OMNIPAQUE IOHEXOL 300 MG/ML  SOLN  COMPARISON:  None available for comparison at time of study interpretation.  FINDINGS: LUNG BASES: Included view of the lung bases are clear. Visualized heart and pericardium are unremarkable.  SOLID ORGANS: The liver, spleen, gallbladder, and adrenal glands are unremarkable. Predominately fatty replaced pancreas is otherwise unremarkable.  GASTROINTESTINAL TRACT: Diffuse colonic wall thickening, from the cecum to the rectum. Additional scattered colonic diverticula. Small hiatal hernia. The stomach, small bowel are normal in course and caliber without inflammatory changes. The appendix is not discretely identified, however there are no inflammatory changes in the right lower quadrant.  KIDNEYS/ URINARY TRACT: Kidneys are orthotopic, demonstrating symmetric enhancement. Solid appearing exophytic left lower pole 13 millimeter mass shows washout on delayed phase. No hydronephrosis. Left parapelvic cysts. Punctate left interpolar nephrolithiasis. Too small to characterize hypodensities in the kidneys bilaterally. The unopacified ureters are normal in course and caliber. Delayed imaging through the kidneys demonstrates symmetric prompt excretion . Urinary bladder is decompressed and unremarkable.  PERITONEUM/RETROPERITONEUM: No  intraperitoneal free fluid nor free air. Aortoiliac vessels are normal in course and caliber, mild calcific atherosclerosis. No lymphadenopathy by CT size criteria. Prostate brachytherapy seeds.  SOFT TISSUE/OSSEOUS STRUCTURES: Nonsuspicious. Sacralized right L5 vertebra. Moderate bilateral hip osteoarthrosis. Severe lower lumbar spondylosis resulting in severe L4-5 bilateral and left L5-S1 neural foraminal narrowing.  IMPRESSION: Diffuse colitis, without bowel perforation, obstruction or abscess. Superimposed diverticulosis.  Incidental 13 mm solid right lower pole exophytic renal mass, consider dedicated renal MRI versus biopsy. This recommendation follows ACR consensus guidelines: Managing Incidental Findings on Abdominal CT: White Paper of the ACR Incidental Findings Committee. J Am Coll Radiol 7124;5:[id number]  Undo finding of renal mass and recommendations will be called to the primary care physician by the Radiologist Assistant, and communication documented in the PACS or zVision Dashboard.   Electronically Signed   By: Elon Alas   On: 12/08/2013 14:59     EKG Interpretation None      MDM Pt advised of results.   I will schedule MRi.   Pt advised to see Dr. Olen Pel for recheck in  2-3 days for recheck.    I suspect symptoms are due to colitis.    Final diagnoses:  Colitis  Renal mass, right   Pt sees [facility].    Pt will call and shedule appointment.   Mri to be scheduled Cipro Flagyl Percocet zofran odt    Fransico Meadow, [place] 12/08/13 1541

## 2013-12-08 NOTE — ED Notes (Signed)
Developed body aches, sweats , fever and diarrhea Monday.

## 2013-12-09 ENCOUNTER — Other Ambulatory Visit (HOSPITAL_BASED_OUTPATIENT_CLINIC_OR_DEPARTMENT_OTHER): Payer: Self-pay | Admitting: Emergency Medicine

## 2013-12-09 DIAGNOSIS — N2889 Other specified disorders of kidney and ureter: Secondary | ICD-10-CM

## 2013-12-09 NOTE — ED Provider Notes (Signed)
Medical screening examination/treatment/procedure(s) were performed by non-physician practitioner and as supervising physician I was immediately available for consultation/collaboration.   EKG Interpretation None       Threasa Beards, MD 12/09/13 [phone]

## 2013-12-10 ENCOUNTER — Other Ambulatory Visit (HOSPITAL_BASED_OUTPATIENT_CLINIC_OR_DEPARTMENT_OTHER): Payer: 59

## 2013-12-10 ENCOUNTER — Ambulatory Visit (HOSPITAL_BASED_OUTPATIENT_CLINIC_OR_DEPARTMENT_OTHER)
Admission: RE | Admit: 2013-12-10 | Discharge: 2013-12-10 | Disposition: A | Discharge status: Home / Self Care | Payer: Medicare HMO | Source: Ambulatory Visit | Attending: Emergency Medicine | Admitting: Emergency Medicine

## 2013-12-10 DIAGNOSIS — N2889 Other specified disorders of kidney and ureter: Secondary | ICD-10-CM

## 2013-12-10 DIAGNOSIS — N289 Disorder of kidney and ureter, unspecified: Secondary | ICD-10-CM | POA: Diagnosis present

## 2013-12-10 MED ORDER — GADOBENATE DIMEGLUMINE 529 MG/ML IV SOLN: 20.0000 mL | Freq: Once | INTRAVENOUS | Status: AC | PRN

## 2013-12-19 ENCOUNTER — Other Ambulatory Visit: Payer: Self-pay | Admitting: Urology

## 2013-12-19 DIAGNOSIS — N2889 Other specified disorders of kidney and ureter: Secondary | ICD-10-CM

## 2013-12-27 ENCOUNTER — Ambulatory Visit
Admission: RE | Admit: 2013-12-27 | Discharge: 2013-12-27 | Disposition: A | Discharge status: Home / Self Care | Payer: Commercial Managed Care - HMO | Source: Ambulatory Visit | Attending: Urology | Admitting: Urology

## 2013-12-27 VITALS — BP 135/87 | HR 74 | Temp 98.0°F | Resp 15 | Ht 74.0 in | Wt 225.0 lb

## 2013-12-27 DIAGNOSIS — N2889 Other specified disorders of kidney and ureter: Secondary | ICD-10-CM

## 2013-12-30 ENCOUNTER — Other Ambulatory Visit: Payer: Self-pay | Admitting: Interventional Radiology

## 2013-12-30 DIAGNOSIS — N2889 Other specified disorders of kidney and ureter: Secondary | ICD-10-CM

## 2014-01-11 ENCOUNTER — Inpatient Hospital Stay: Admission: RE | Admit: 2014-01-11 | Payer: 59 | Source: Ambulatory Visit

## 2014-01-12 ENCOUNTER — Encounter (HOSPITAL_COMMUNITY): Payer: Self-pay | Admitting: Pharmacy Technician

## 2014-01-18 NOTE — Progress Notes (Signed)
Please put orders in Epic surgery 01-27-14 pre op 01-20-14 Thanks

## 2014-01-19 ENCOUNTER — Encounter (HOSPITAL_COMMUNITY): Payer: Self-pay | Admitting: Pharmacy Technician

## 2014-01-19 NOTE — Progress Notes (Signed)
Please put orders in Epic surgery 01-27-14 pre op 01-20-14 Thanks

## 2014-01-20 ENCOUNTER — Ambulatory Visit (HOSPITAL_COMMUNITY)
Admission: RE | Admit: 2014-01-20 | Discharge: 2014-01-20 | Disposition: A | Discharge status: Home / Self Care | Payer: Medicare HMO | Source: Ambulatory Visit | Attending: Anesthesiology | Admitting: Anesthesiology

## 2014-01-20 ENCOUNTER — Encounter (HOSPITAL_COMMUNITY): Payer: Self-pay

## 2014-01-20 ENCOUNTER — Encounter (HOSPITAL_COMMUNITY)
Admission: RE | Admit: 2014-01-20 | Discharge: 2014-01-20 | Disposition: A | Discharge status: Home / Self Care | Payer: Medicare HMO | Source: Ambulatory Visit | Attending: Interventional Radiology | Admitting: Interventional Radiology

## 2014-01-20 DIAGNOSIS — R062 Wheezing: Secondary | ICD-10-CM | POA: Diagnosis not present

## 2014-01-20 DIAGNOSIS — N289 Disorder of kidney and ureter, unspecified: Secondary | ICD-10-CM | POA: Diagnosis not present

## 2014-01-20 HISTORY — DX: Hypothyroidism, unspecified: E03.9

## 2014-01-20 HISTORY — DX: Disease of blood and blood-forming organs, unspecified: D75.9

## 2014-01-20 HISTORY — DX: Sleep apnea, unspecified: G47.30

## 2014-01-20 HISTORY — DX: Personal history of other specified conditions: Z87.898

## 2014-01-20 HISTORY — DX: Other specified disorders of kidney and ureter: N28.89

## 2014-01-20 LAB — CBC
HCT: 49.3 % (ref 39.0–52.0)
Hemoglobin: 16.7 g/dL (ref 13.0–17.0)
MCH: 31.2 pg (ref 26.0–34.0)
MCHC: 33.9 g/dL (ref 30.0–36.0)
MCV: 92 fL (ref 78.0–100.0)
Platelets: 240 10*3/uL (ref 150–400)
RBC: 5.36 MIL/uL (ref 4.22–5.81)
RDW: 14.7 % (ref 11.5–15.5)
WBC: 7.9 10*3/uL (ref 4.0–10.5)

## 2014-01-20 LAB — COMPREHENSIVE METABOLIC PANEL
ALT: 27 U/L (ref 0–53)
AST: 28 U/L (ref 0–37)
Albumin: 3.9 g/dL (ref 3.5–5.2)
Alkaline Phosphatase: 89 U/L (ref 39–117)
Anion gap: 11 (ref 5–15)
BUN: 19 mg/dL (ref 6–23)
CO2: 25 mEq/L (ref 19–32)
Calcium: 9.8 mg/dL (ref 8.4–10.5)
Chloride: 106 mEq/L (ref 96–112)
Creatinine, Ser: 0.95 mg/dL (ref 0.50–1.35)
GFR calc Af Amer: >90 mL/min (ref >90)
GFR calc non Af Amer: 84 mL/min — ABNORMAL LOW (ref >90)
Glucose, Bld: 131 mg/dL — ABNORMAL HIGH (ref 70–99)
Potassium: 4.5 mEq/L (ref 3.7–5.3)
Sodium: 142 mEq/L (ref 137–147)
Total Bilirubin: 0.6 mg/dL (ref 0.3–1.2)
Total Protein: 6.9 g/dL (ref 6.0–8.3)

## 2014-01-20 LAB — ABO/RH: ABO/RH(D): O POS

## 2014-01-20 LAB — APTT: aPTT: 33 seconds (ref 24–37)

## 2014-01-20 LAB — PROTIME-INR
INR: 1.01 (ref 0.00–1.49)
Prothrombin Time: 13.3 seconds (ref 11.6–15.2)

## 2014-01-20 NOTE — Patient Instructions (Signed)
YOUR SURGERY IS SCHEDULED AT [facility]  ON:  Friday  9/18  REPORT TO  SHORT STAY CENTER AT:  9:00 AM   PLEASE COME IN THE [place] ENTRANCE AND FOLLOW SIGNS TO SHORT STAY CENTER.  DO NOT EAT OR DRINK ANYTHING AFTER MIDNIGHT THE NIGHT BEFORE YOUR SURGERY.  YOU MAY BRUSH YOUR TEETH, RINSE OUT YOUR MOUTH--BUT NO WATER, NO FOOD, NO CHEWING GUM, NO MINTS, NO CANDIES, NO CHEWING TOBACCO.  PLEASE TAKE THE FOLLOWING MEDICATIONS THE AM OF YOUR SURGERY WITH A FEW SIPS OF WATER:  LEVOTHYROXINE.  USE YOUR EYE DROPS AND INHALER AND BRING EYE DROPS AND INHALER.    DO NOT BRING VALUABLES, MONEY, CREDIT CARDS.  DO NOT WEAR JEWELRY, MAKE-UP, NAIL POLISH AND NO METAL PINS OR CLIPS IN YOUR HAIR. CONTACT LENS, DENTURES / PARTIALS, GLASSES SHOULD NOT BE WORN TO SURGERY AND IN MOST CASES-HEARING AIDS WILL NEED TO BE REMOVED.  BRING YOUR GLASSES CASE, ANY EQUIPMENT NEEDED FOR YOUR CONTACT LENS. FOR PATIENTS ADMITTED TO THE HOSPITAL--CHECK OUT TIME THE DAY OF DISCHARGE IS 11:00 AM.  ALL INPATIENT ROOMS ARE PRIVATE - WITH BATHROOM, TELEPHONE, TELEVISION AND WIFI INTERNET.   PLEASE BE AWARE THAT YOU MAY NEED ADDITIONAL BLOOD DRAWN DAY OF YOUR SURGERY  _______________________________________________________________________   [facility] - Preparing for Surgery Before surgery, you can play an important role.  Because skin is not sterile, your skin needs to be as free of germs as possible.  You can reduce the number of germs on your skin by washing with CHG (chlorahexidine gluconate) soap before surgery.  CHG is an antiseptic cleaner which kills germs and bonds with the skin to continue killing germs even after washing. Please DO NOT use if you have an allergy to CHG or antibacterial soaps.  If your skin becomes reddened/irritated stop using the CHG and inform your nurse when you arrive at Short Stay. Do not shave (including legs and underarms) for at least 48 hours prior to the first CHG  shower.  You may shave your face/neck. Please follow these instructions carefully:  1.  Shower with CHG Soap the night before surgery and the  morning of Surgery.  2.  If you choose to wash your hair, wash your hair first as usual with your  normal  shampoo.  3.  After you shampoo, rinse your hair and body thoroughly to remove the  shampoo.                           4.  Use CHG as you would any other liquid soap.  You can apply chg directly  to the skin and wash                       Gently with a scrungie or clean washcloth.  5.  Apply the CHG Soap to your body ONLY FROM THE NECK DOWN.   Do not use on face/ open                           Wound or open sores. Avoid contact with eyes, ears mouth and genitals (private parts).                       Wash face,  Genitals (private parts) with your normal soap.  6.  Wash thoroughly, paying special attention to the area where your surgery  will be performed.  7.  Thoroughly rinse your body with warm water from the neck down.  8.  DO NOT shower/wash with your normal soap after using and rinsing off  the CHG Soap.                9.  Pat yourself dry with a clean towel.            10.  Wear clean pajamas.            11.  Place clean sheets on your bed the night of your first shower and do not  sleep with pets. Day of Surgery : Do not apply any lotions/deodorants the morning of surgery.  Please wear clean clothes to the hospital/surgery center.  FAILURE TO FOLLOW THESE INSTRUCTIONS MAY RESULT IN THE CANCELLATION OF YOUR SURGERY PATIENT SIGNATURE_________________________________  NURSE SIGNATURE__________________________________  ________________________________________________________________________

## 2014-01-20 NOTE — Pre-Procedure Instructions (Signed)
EKG NOT NEEDED PER ANESTHESIOLOGIST'S GUIDELINES.  CXR WAS DONE TODAY PREOP AT [facility].

## 2014-01-24 ENCOUNTER — Other Ambulatory Visit: Payer: Self-pay | Admitting: Radiology

## 2014-01-25 ENCOUNTER — Other Ambulatory Visit: Payer: Self-pay | Admitting: Radiology

## 2014-01-26 ENCOUNTER — Other Ambulatory Visit: Payer: Self-pay | Admitting: Radiology

## 2014-01-27 ENCOUNTER — Encounter (HOSPITAL_COMMUNITY): Payer: Self-pay | Admitting: *Deleted

## 2014-01-27 ENCOUNTER — Encounter (HOSPITAL_COMMUNITY): Payer: Self-pay

## 2014-01-27 ENCOUNTER — Encounter (HOSPITAL_COMMUNITY): Payer: Self-pay | Admitting: Anesthesiology

## 2014-01-27 ENCOUNTER — Ambulatory Visit (HOSPITAL_COMMUNITY)
Admission: RE | Admit: 2014-01-27 | Discharge: 2014-01-27 | Disposition: A | Discharge status: Home / Self Care | Payer: Medicare HMO | Source: Ambulatory Visit | Attending: Interventional Radiology | Admitting: Interventional Radiology

## 2014-01-27 ENCOUNTER — Encounter (HOSPITAL_COMMUNITY)
Admission: RE | Disposition: A | Discharge status: Home / Self Care | Payer: Self-pay | Source: Ambulatory Visit | Attending: Interventional Radiology

## 2014-01-27 ENCOUNTER — Observation Stay (HOSPITAL_COMMUNITY)
Admission: RE | Admit: 2014-01-27 | Discharge: 2014-01-28 | Disposition: A | Discharge status: Home / Self Care | Payer: Medicare HMO | Source: Ambulatory Visit | Attending: Interventional Radiology | Admitting: Interventional Radiology

## 2014-01-27 ENCOUNTER — Encounter (HOSPITAL_COMMUNITY): Payer: Medicare HMO | Admitting: Certified Registered"

## 2014-01-27 ENCOUNTER — Inpatient Hospital Stay (HOSPITAL_COMMUNITY): Payer: Medicare HMO | Admitting: Certified Registered"

## 2014-01-27 VITALS — BP 118/82 | HR 76 | Temp 98.2°F | Resp 16

## 2014-01-27 DIAGNOSIS — Z79899 Other long term (current) drug therapy: Secondary | ICD-10-CM | POA: Insufficient documentation

## 2014-01-27 DIAGNOSIS — Z87891 Personal history of nicotine dependence: Secondary | ICD-10-CM | POA: Insufficient documentation

## 2014-01-27 DIAGNOSIS — Z8546 Personal history of malignant neoplasm of prostate: Secondary | ICD-10-CM | POA: Insufficient documentation

## 2014-01-27 DIAGNOSIS — G4730 Sleep apnea, unspecified: Secondary | ICD-10-CM | POA: Insufficient documentation

## 2014-01-27 DIAGNOSIS — N289 Disorder of kidney and ureter, unspecified: Secondary | ICD-10-CM | POA: Diagnosis present

## 2014-01-27 DIAGNOSIS — E039 Hypothyroidism, unspecified: Secondary | ICD-10-CM | POA: Insufficient documentation

## 2014-01-27 DIAGNOSIS — K5289 Other specified noninfective gastroenteritis and colitis: Secondary | ICD-10-CM | POA: Diagnosis not present

## 2014-01-27 DIAGNOSIS — Z882 Allergy status to sulfonamides status: Secondary | ICD-10-CM | POA: Insufficient documentation

## 2014-01-27 DIAGNOSIS — N2889 Other specified disorders of kidney and ureter: Secondary | ICD-10-CM

## 2014-01-27 HISTORY — DX: Failed or difficult intubation, initial encounter: T88.4XXA

## 2014-01-27 LAB — TYPE AND SCREEN
ABO/RH(D): O POS
Antibody Screen: NEGATIVE

## 2014-01-27 SURGERY — RADIO FREQUENCY ABLATION
Anesthesia: General | Laterality: Right

## 2014-01-27 MED ORDER — LACTATED RINGERS IV SOLN
INTRAVENOUS | Status: DC
  Administered 2014-01-27 (×2): via INTRAVENOUS

## 2014-01-27 MED ORDER — FENTANYL CITRATE 0.05 MG/ML IJ SOLN
INTRAMUSCULAR | Status: AC
  Filled 2014-01-27: qty 5

## 2014-01-27 MED ORDER — FENTANYL CITRATE 0.05 MG/ML IJ SOLN
INTRAMUSCULAR | Status: DC | PRN
  Administered 2014-01-27: 100 ug via INTRAVENOUS
  Administered 2014-01-27: 50 ug via INTRAVENOUS

## 2014-01-27 MED ORDER — PANTOPRAZOLE SODIUM 40 MG PO TBEC
40.0000 mg | DELAYED_RELEASE_TABLET | Freq: Every day | ORAL | Status: DC
  Administered 2014-01-27 – 2014-01-28 (×2): 40 mg via ORAL
  Filled 2014-01-27 (×2): qty 1

## 2014-01-27 MED ORDER — ALPRAZOLAM 0.25 MG PO TABS: 0.1250 mg | ORAL_TABLET | Freq: Every evening | ORAL | Status: DC | PRN

## 2014-01-27 MED ORDER — SODIUM CHLORIDE 0.9 % IV SOLN
INTRAVENOUS | Status: DC
  Administered 2014-01-27: 1000 mL via INTRAVENOUS
  Administered 2014-01-27: 23:00:00 via INTRAVENOUS

## 2014-01-27 MED ORDER — PHENYLEPHRINE HCL 10 MG/ML IJ SOLN
INTRAMUSCULAR | Status: DC | PRN
  Administered 2014-01-27 (×3): 40 ug via INTRAVENOUS

## 2014-01-27 MED ORDER — ROCURONIUM BROMIDE 100 MG/10ML IV SOLN
INTRAVENOUS | Status: DC | PRN
  Administered 2014-01-27: 15 mg via INTRAVENOUS
  Administered 2014-01-27: 50 mg via INTRAVENOUS

## 2014-01-27 MED ORDER — PROPOFOL 10 MG/ML IV BOLUS
INTRAVENOUS | Status: DC | PRN
  Administered 2014-01-27: 20 mg via INTRAVENOUS
  Administered 2014-01-27: 200 mg via INTRAVENOUS

## 2014-01-27 MED ORDER — ALBUTEROL SULFATE (2.5 MG/3ML) 0.083% IN NEBU: 2.5000 mg | INHALATION_SOLUTION | RESPIRATORY_TRACT | Status: DC | PRN

## 2014-01-27 MED ORDER — CEFAZOLIN SODIUM-DEXTROSE 2-3 GM-% IV SOLR
INTRAVENOUS | Status: AC
  Filled 2014-01-27: qty 50

## 2014-01-27 MED ORDER — LABETALOL HCL 5 MG/ML IV SOLN
INTRAVENOUS | Status: AC
  Filled 2014-01-27: qty 4

## 2014-01-27 MED ORDER — ONDANSETRON HCL 4 MG/2ML IJ SOLN: 4.0000 mg | Freq: Four times a day (QID) | INTRAMUSCULAR | Status: DC | PRN

## 2014-01-27 MED ORDER — NEOSTIGMINE METHYLSULFATE 10 MG/10ML IV SOLN
INTRAVENOUS | Status: DC | PRN
  Administered 2014-01-27: 5 mg via INTRAVENOUS

## 2014-01-27 MED ORDER — EPHEDRINE SULFATE 50 MG/ML IJ SOLN
INTRAMUSCULAR | Status: DC | PRN
  Administered 2014-01-27: 5 mg via INTRAVENOUS
  Administered 2014-01-27: 15 mg via INTRAVENOUS
  Administered 2014-01-27 (×4): 5 mg via INTRAVENOUS

## 2014-01-27 MED ORDER — LABETALOL HCL 5 MG/ML IV SOLN
5.0000 mg | INTRAVENOUS | Status: DC | PRN
  Administered 2014-01-27: 5 mg via INTRAVENOUS

## 2014-01-27 MED ORDER — SENNOSIDES-DOCUSATE SODIUM 8.6-50 MG PO TABS
1.0000 | ORAL_TABLET | Freq: Every day | ORAL | Status: DC | PRN
  Filled 2014-01-27: qty 1

## 2014-01-27 MED ORDER — HYDROCODONE-ACETAMINOPHEN 5-325 MG PO TABS: 1.0000 | ORAL_TABLET | ORAL | Status: DC | PRN

## 2014-01-27 MED ORDER — CEFAZOLIN SODIUM-DEXTROSE 2-3 GM-% IV SOLR
2.0000 g | Freq: Once | INTRAVENOUS | Status: AC
  Administered 2014-01-27: 2 g via INTRAVENOUS

## 2014-01-27 MED ORDER — GLYCOPYRROLATE 0.2 MG/ML IJ SOLN
INTRAMUSCULAR | Status: AC
  Filled 2014-01-27: qty 3

## 2014-01-27 MED ORDER — ROCURONIUM BROMIDE 100 MG/10ML IV SOLN
INTRAVENOUS | Status: AC
  Filled 2014-01-27: qty 1

## 2014-01-27 MED ORDER — DOCUSATE SODIUM 100 MG PO CAPS
100.0000 mg | ORAL_CAPSULE | Freq: Two times a day (BID) | ORAL | Status: DC
  Administered 2014-01-27: 100 mg via ORAL
  Filled 2014-01-27 (×3): qty 1

## 2014-01-27 MED ORDER — ALBUTEROL SULFATE HFA 108 (90 BASE) MCG/ACT IN AERS: 2.0000 | INHALATION_SPRAY | RESPIRATORY_TRACT | Status: DC | PRN

## 2014-01-27 MED ORDER — MIDAZOLAM HCL 5 MG/5ML IJ SOLN
INTRAMUSCULAR | Status: DC | PRN
  Administered 2014-01-27: 2 mg via INTRAVENOUS

## 2014-01-27 MED ORDER — GLYCOPYRROLATE 0.2 MG/ML IJ SOLN
INTRAMUSCULAR | Status: DC | PRN
  Administered 2014-01-27: .8 mg via INTRAVENOUS

## 2014-01-27 MED ORDER — DEXAMETHASONE SODIUM PHOSPHATE 10 MG/ML IJ SOLN
INTRAMUSCULAR | Status: DC | PRN
  Administered 2014-01-27: 10 mg via INTRAVENOUS

## 2014-01-27 MED ORDER — FENTANYL CITRATE 0.05 MG/ML IJ SOLN: 25.0000 ug | INTRAMUSCULAR | Status: DC | PRN

## 2014-01-27 MED ORDER — ESMOLOL HCL 10 MG/ML IV SOLN
INTRAVENOUS | Status: DC | PRN
  Administered 2014-01-27: 25 mg via INTRAVENOUS
  Administered 2014-01-27: 30 mg via INTRAVENOUS

## 2014-01-27 MED ORDER — LEVOTHYROXINE SODIUM 112 MCG PO TABS
112.0000 ug | ORAL_TABLET | Freq: Every day | ORAL | Status: DC
  Administered 2014-01-28: 112 ug via ORAL
  Filled 2014-01-27 (×2): qty 1

## 2014-01-27 MED ORDER — LIDOCAINE HCL (CARDIAC) 20 MG/ML IV SOLN
INTRAVENOUS | Status: DC | PRN
  Administered 2014-01-27: 50 mg via INTRAVENOUS

## 2014-01-27 MED ORDER — POLYVINYL ALCOHOL 1.4 % OP SOLN
1.0000 [drp] | OPHTHALMIC | Status: DC
  Administered 2014-01-27 – 2014-01-28 (×6): 1 [drp] via OPHTHALMIC
  Filled 2014-01-27: qty 15

## 2014-01-27 MED ORDER — MIDAZOLAM HCL 2 MG/2ML IJ SOLN
INTRAMUSCULAR | Status: AC
  Filled 2014-01-27: qty 2

## 2014-01-27 MED ORDER — ONDANSETRON HCL 4 MG/2ML IJ SOLN
INTRAMUSCULAR | Status: DC | PRN
  Administered 2014-01-27: 4 mg via INTRAVENOUS

## 2014-01-27 MED ORDER — NEOSTIGMINE METHYLSULFATE 10 MG/10ML IV SOLN
INTRAVENOUS | Status: AC
  Filled 2014-01-27: qty 1

## 2014-01-27 MED ORDER — PROMETHAZINE HCL 25 MG/ML IJ SOLN: 6.2500 mg | INTRAMUSCULAR | Status: DC | PRN

## 2014-01-27 NOTE — Addendum Note (Signed)
Addendum created 01/27/14 1446 by Milana Obey, MD   Modules edited: Orders

## 2014-01-27 NOTE — Progress Notes (Signed)
Patient ID: Eric Crosby, male   DOB: 12/07/1945, 68 y.o.   MRN: [id number]   Referring Physician(s): Dr. Risa Grill  Subjective: pt doing well; denies sig CP, dyspnea, N/V, flank pain or hematuria; would like foley cath out    Allergies: Sulfa antibiotics  Medications: Prior to Admission medications   Medication Sig Start Date End Date Taking? Authorizing Provider  albuterol (PROVENTIL HFA;VENTOLIN HFA) 108 (90 BASE) MCG/ACT inhaler Inhale 2 puffs into the lungs every 4 (four) hours as needed for wheezing or shortness of breath.    Yes Historical Provider, MD  ALPRAZolam (XANAX) 0.25 MG tablet Take 0.125 mg by mouth at bedtime as needed for anxiety.    Yes Historical Provider, MD  Fish Oil-Cholecalciferol (FISH OIL + D3 PO) Take 1 tablet by mouth. TAKES ONE EVERY OTHER DAY   Yes Historical Provider, MD  Ibuprofen (ADVIL) 200 MG CAPS Take 400-600 mg by mouth See admin instructions. Pt takes three tablets in the morning (600 mg) and two tablets at night (400mg )   Yes Historical Provider, MD  levothyroxine (SYNTHROID, LEVOTHROID) 112 MCG tablet Take 112 mcg by mouth daily before breakfast.   Yes Historical Provider, MD  meclizine (ANTIVERT) 25 MG tablet Take 25 mg by mouth once as needed for dizziness.   Yes Historical Provider, MD  Multiple Vitamins-Minerals (MULTIVITAMIN & MINERAL PO) Take 1 tablet by mouth every morning.    Yes Historical Provider, MD  omeprazole (PRILOSEC) 20 MG capsule Take 20 mg by mouth at bedtime.    Yes Historical Provider, MD  polyvinyl alcohol (LIQUIFILM TEARS) 1.4 % ophthalmic solution Place 1 drop into both eyes every 3 (three) hours.   Yes Historical Provider, MD  saw palmetto 160 MG capsule Take 160 mg by mouth at bedtime.    Yes Historical Provider, MD  ST JOHNS WORT PO Take 1 tablet by mouth every morning.    Yes Historical Provider, MD    Review of Systems  see above  Vital Signs: BP 145/77  Pulse 91  Temp(Src) 97.3 F (36.3 C) (Oral)  Resp 17  SpO2  96%  Physical Exam pt awake/alert; puncture site rt flank clean and dry,NT; yellow urine in foley  Imaging: Ct Guide Tissue Ablation  01/27/2014   CLINICAL DATA:  12-13 mm exophytic mass of posterior lower right kidney. The patient presents for cryoablation of the tumor.  EXAM: CT-GUIDED PERCUTANEOUS CRYOABLATION OF RIGHT RENAL MASS  ANESTHESIA/SEDATION: General  MEDICATIONS: 2 g IV Ancef. As antibiotic prophylaxis, Ancef was ordered pre-procedure and administered intravenously within one hour of incision.  CONTRAST:  None  PROCEDURE: The procedure, risks, benefits, and alternatives were explained to the patient. Questions regarding the procedure were encouraged and answered. The patient understands and consents to the procedure.  The patient was placed under general anesthesia. Initial unenhanced CT was performed in a prone position to localize the right renal mass.  The right flank region was prepped with Betadine in a sterile fashion, and a sterile drape was applied covering the operative field. A sterile gown and sterile gloves were used for the procedure. A time-out procedure was performed.  Under CT guidance, a 17 G Endocare percutaneous cryoablation probe was advanced into the right renal mass. Probe positioning was confirmed by CT prior to cryoablation.  Cryoablation was performed through the single probe. Initial 10 minute cycle of cryoablation was performed. This was followed by a 6 minute thaw cycle. A second 8 minute cycle of cryoablation was then performed. During ablation, periodic  CT imaging was performed to monitor ice ball formation and morphology. After active thaw, the cryoablation probe was removed.  COMPLICATIONS: None  FINDINGS: Initial unenhanced imaging shows the exophytic lesion emanating from the posterior lower pole cortex of the right kidney. Maximal lesion diameter is approximately 11 mm by unenhanced CT. After placement of a single cryoablation probe through the lesion, imaging  during treatment shows adequate ice ball formation encompassing the entire lesion and extending just into the adjacent kidney. No complication was evident during the procedure.  IMPRESSION: CT guided percutaneous cryoablation of right renal mass. The patient will be observed overnight. Initial follow-up will be performed in approximately 4 weeks.   Electronically Signed   By: Aletta Edouard M.D.   On: 01/27/2014 15:25    Labs: Results for orders placed during the hospital encounter of 01/20/14  CBC      Result Value Ref Range   WBC 7.9  4.0 - 10.5 K/uL   RBC 5.36  4.22 - 5.81 MIL/uL   Hemoglobin 16.7  13.0 - 17.0 g/dL   HCT 49.3  39.0 - 52.0 %   MCV 92.0  78.0 - 100.0 fL   MCH 31.2  26.0 - 34.0 pg   MCHC 33.9  30.0 - 36.0 g/dL   RDW 14.7  11.5 - 15.5 %   Platelets 240  150 - 400 K/uL  COMPREHENSIVE METABOLIC PANEL      Result Value Ref Range   Sodium 142  137 - 147 mEq/L   Potassium 4.5  3.7 - 5.3 mEq/L   Chloride 106  96 - 112 mEq/L   CO2 25  19 - 32 mEq/L   Glucose, Bld 131 (*) 70 - 99 mg/dL   BUN 19  6 - 23 mg/dL   Creatinine, Ser 0.95  0.50 - 1.35 mg/dL   Calcium 9.8  8.4 - 10.5 mg/dL   Total Protein 6.9  6.0 - 8.3 g/dL   Albumin 3.9  3.5 - 5.2 g/dL   AST 28  0 - 37 U/L   ALT 27  0 - 53 U/L   Alkaline Phosphatase 89  39 - 117 U/L   Total Bilirubin 0.6  0.3 - 1.2 mg/dL   GFR calc non Af Amer 84 (*) >90 mL/min   GFR calc Af Amer >90  >90 mL/min   Anion gap 11  5 - 15  APTT      Result Value Ref Range   aPTT 33  24 - 37 seconds  PROTIME-INR      Result Value Ref Range   Prothrombin Time 13.3  11.6 - 15.2 seconds   INR 1.01  0.00 - 1.49  TYPE AND SCREEN      Result Value Ref Range   ABO/RH(D) O POS     Antibody Screen NEG     Sample Expiration 01/30/2014    ABO/RH      Result Value Ref Range   ABO/RH(D) O POS      Assessment and Plan: S/p CT guided right renal mass cryoablation 9/18 ; for overnight observation; check am labs; remove foley cath now; f/u with Dr.  Kathlene Cote in [place] clinic in 4 weeks with CT; also recommend f/u with Dr. Fransico Him regarding recent finding of premature supraventricular complexes on EKG  Rowe Robert, PAC

## 2014-01-27 NOTE — Procedures (Signed)
Procedure:  CT guided cryoablation of right renal mass Anesthesia:  General Findings:  Perc cryo performed with 17 G Endocare cryo probe from posterior approach.   No complications. Plan:  PACU recovery followed by overnight observation.

## 2014-01-27 NOTE — Transfer of Care (Signed)
Immediate Anesthesia Transfer of Care Note  Patient: Eric Crosby  Procedure(s) Performed: Procedure(s): RIGHT RENAL CRYOABLATION (Right)  Patient Location: PACU  Anesthesia Type:General  Level of Consciousness: awake and patient cooperative  Airway & Oxygen Therapy: Patient Spontanous Breathing and Patient connected to face mask oxygen  Post-op Assessment: Report given to PACU RN and Post -op Vital signs reviewed and stable  Post vital signs: Reviewed and stable  Complications: No apparent anesthesia complications

## 2014-01-27 NOTE — Progress Notes (Signed)
Foley catheter d/c'd at this time, patient tolerated the procedure.Sandie Ano RN

## 2014-01-27 NOTE — Anesthesia Preprocedure Evaluation (Signed)
Anesthesia Evaluation  Patient identified by MRN, date of birth, ID band Patient awake    Reviewed: Allergy & Precautions, H&P , NPO status , Patient's Chart, lab work & pertinent test results  History of Anesthesia Complications Negative for: history of anesthetic complications  Airway Mallampati: II TM Distance: >3 FB Neck ROM: Full    Dental no notable dental hx.    Pulmonary sleep apnea , former smoker,  breath sounds clear to auscultation  Pulmonary exam normal       Cardiovascular negative cardio ROS  Rhythm:Regular Rate:Normal     Neuro/Psych negative neurological ROS  negative psych ROS   GI/Hepatic negative GI ROS, Neg liver ROS,   Endo/Other  negative endocrine ROSHypothyroidism   Renal/GU negative Renal ROS  negative genitourinary   Musculoskeletal negative musculoskeletal ROS (+)   Abdominal   Peds negative pediatric ROS (+)  Hematology   Anesthesia Other Findings   Reproductive/Obstetrics negative OB ROS                           Anesthesia Physical Anesthesia Plan  ASA: III  Anesthesia Plan: General   Post-op Pain Management:    Induction: Intravenous  Airway Management Planned:   Additional Equipment:   Intra-op Plan:   Post-operative Plan: Extubation in OR  Informed Consent: I have reviewed the patients History and Physical, chart, labs and discussed the procedure including the risks, benefits and alternatives for the proposed anesthesia with the patient or authorized representative who has indicated his/her understanding and acceptance.   Dental advisory given  Plan Discussed with: CRNA  Anesthesia Plan Comments:         Anesthesia Quick Evaluation

## 2014-01-27 NOTE — Progress Notes (Signed)
Patient came to unit at 1510,from PACU, s/p R renal mass cryoablation. Alert and orientedx4. Denies pain.Placed comfortably in bed. Will continue to monitor the patient.-Sheneika Walstad [place]

## 2014-01-27 NOTE — H&P (Signed)
Agree.  Patient seen and examined.  For cryoablation of right renal mass today under general anesthesia.  Overnight observation after procedure.

## 2014-01-27 NOTE — H&P (Signed)
Chief Complaint: right renal mass   Referring Physician(s): Dr. Ellwood Sayers  History of Present Illness: Eric Crosby is a 68 y.o. male who recently was treated for  colitis. CT performed to diagnose colitis on 12/08/2013 showed an  incidental, roughly 13 mm exophytic enhancing mass emanating from  the posterior lower pole cortex of the right kidney. Further  evaluation by MRI demonstrated this to be a solid enhancing mass  measuring approximately 13 mm in greatest diameter. There were no  other suspicious lesions or evidence of metastatic disease in the  abdomen. The patient has been asymptomatic with regard to the renal  mass and has no history of malignancy or prior renal surgery. Following IR consultation he presents today for elective CT guided percutaneous cryoablation/possible biopsy of the right renal mass.   Past Medical History  Diagnosis Date  . Diverticulosis     COLITIS  . OCP (ocular cicatricial pemphigoid)     IN REMISSION FOR PAST 18 YRS BUT STILL EXPERIENCE EYE PAIN DAILY AND HX OF MULTIPLE SURGERIES  . Right renal mass   . Hx of wheezing     OCCAS WHEEZING - POSS RELATED TO ALLERGIES OR ENVIRONMENTAL ISSUES-HAVE INHALER BUT HAS RARELY NEEDED  . Hypothyroidism   . Blood dyscrasia     POLYCYTHEMIA- NO MEDICATION OR TREATMENT NEEDED - PT TOLD TO MAKE EVERYONE AWARE HIS RBC CT HIGH - BUT NORMAL FOR HIM  . Prostate cancer 2011    TX'D WITH SEED IMPLANT  . Sleep apnea     PT USED CPAP FOR SEVERAL YEARS BUT IT MADE HIS EYE PROBLEM WORSE AND HE CAN NOT USE THE MACHINE NOW.    Past Surgical History  Procedure Laterality Date  . Colonoscopy    . Eyelid surgery      17 SURGICAL PROCEDURES BLATERAL UPPER EYE LIDS  . Neck surgery      CERVICAL FUSION - PT STATES SLIGHT LIMITED ROM  . Right retinal surgery for buckle    . Eye surgery    . Right cataract extraction with lens implant    . Seed implant for prostate cancer    . Hernia repair      UMBILICAL  AND  LOWER ABDOMINAL HERNIA REPAIR  . Wisdom tooth extraction      Allergies: Sulfa antibiotics  Medications: Prior to Admission medications   Medication Sig Start Date End Date Taking? Authorizing Provider  albuterol (PROVENTIL HFA;VENTOLIN HFA) 108 (90 BASE) MCG/ACT inhaler Inhale 2 puffs into the lungs every 4 (four) hours as needed for wheezing or shortness of breath.     Historical Provider, MD  ALPRAZolam Duanne Moron) 0.25 MG tablet Take 0.125 mg by mouth at bedtime as needed for anxiety.     Historical Provider, MD  Fish Oil-Cholecalciferol (FISH OIL + D3 PO) Take 1 tablet by mouth. TAKES ONE EVERY OTHER DAY    Historical Provider, MD  Ibuprofen (ADVIL) 200 MG CAPS Take 400-600 mg by mouth See admin instructions. Pt takes three tablets in the morning (600 mg) and two tablets at night (400mg )    Historical Provider, MD  levothyroxine (SYNTHROID, LEVOTHROID) 112 MCG tablet Take 112 mcg by mouth daily before breakfast.    Historical Provider, MD  meclizine (ANTIVERT) 25 MG tablet Take 25 mg by mouth once as needed for dizziness.    Historical Provider, MD  Multiple Vitamins-Minerals (MULTIVITAMIN & MINERAL PO) Take 1 tablet by mouth every morning.     Historical Provider, MD  omeprazole (PRILOSEC) 20 MG capsule Take 20 mg by mouth at bedtime.     Historical Provider, MD  polyvinyl alcohol (LIQUIFILM TEARS) 1.4 % ophthalmic solution Place 1 drop into both eyes every 3 (three) hours.    Historical Provider, MD  saw palmetto 160 MG capsule Take 160 mg by mouth at bedtime.     Historical Provider, MD  ST JOHNS WORT PO Take 1 tablet by mouth every morning.     Historical Provider, MD    FH: Grandmother with history of breast cancer. Father  with history of liver cancer and lung cancer. Mother with history of  thyroid cancer and breast cancer. Brother with history of colon  cancer.   History   Social History  . Marital Status: Married    Spouse Name: N/A    Number of Children: N/A  . Years of  Education: N/A   Social History Main Topics  . Smoking status: Former Research scientist (life sciences)  . Smokeless tobacco: Never Used  . Alcohol Use: No     Comment: QUIT SMOKING 25 YRS AGO  . Drug Use: No  . Sexual Activity: None   Other Topics Concern  . None   Social History Narrative  . None         Review of Systems  Constitutional: Positive for fatigue. Negative for fever and chills.  HENT: Positive for tinnitus.   Respiratory: Negative for shortness of breath.        Occ dry cough  Cardiovascular: Negative for chest pain.  Gastrointestinal: Negative for nausea, vomiting, abdominal pain, diarrhea and blood in stool.  Genitourinary: Negative for dysuria, urgency, frequency, hematuria and flank pain.  Musculoskeletal: Negative for back pain.  Neurological: Negative for headaches.  Hematological: Does not bruise/bleed easily.  Psychiatric/Behavioral:       Hx sleep apnea but does not use CPAP    Vital Signs: BP 118/82  HR 76  R 16  TEMP 98.2  O2 SATS 96% RA  Physical Exam  Constitutional: He is oriented to person, place, and time. He appears well-developed and well-nourished.  Cardiovascular: Normal rate.   occ ectopy noted  Pulmonary/Chest: Effort normal and breath sounds normal.  Abdominal: Soft. Bowel sounds are normal. There is no tenderness.  Musculoskeletal: Normal range of motion. He exhibits no edema.  Neurological: He is alert and oriented to person, place, and time.    Imaging: Dg Chest 2 View  01/20/2014   CLINICAL DATA:  68 year old male preoperative study for treatment of right renal mass. Wheezing. Initial encounter.  EXAM: CHEST  2 VIEW  COMPARISON:  02/18/2010 chest x-ray s. CT Abdomen and Pelvis 12/08/2013.  FINDINGS: Stable lung volumes. Stable cardiac size and mediastinal contours. Cervical ACDF hardware. No pneumothorax, pulmonary edema, pleural effusion, consolidation, or pulmonary nodule. No acute pulmonary opacity identified. No acute osseous abnormality  identified.  IMPRESSION: No acute cardiopulmonary abnormality.   Electronically Signed   By: Lars Pinks M.D.   On: 01/20/2014 10:48    Labs: Lab Results  Component Value Date   WBC 7.9 01/20/2014   HCT 49.3 01/20/2014   MCV 92.0 01/20/2014   PLT 240 01/20/2014   NA 142 01/20/2014   K 4.5 01/20/2014   CL 106 01/20/2014   CO2 25 01/20/2014   GLUCOSE 131* 01/20/2014   BUN 19 01/20/2014   CREATININE 0.95 01/20/2014   CALCIUM 9.8 01/20/2014   PROT 6.9 01/20/2014   ALBUMIN 3.9 01/20/2014   AST 28 01/20/2014   ALT 27 01/20/2014  ALKPHOS 89 01/20/2014   BILITOT 0.6 01/20/2014   GFRNONAA 84* 01/20/2014   GFRAA >90 01/20/2014   INR 1.01 01/20/2014    Assessment and Plan:  Eric Crosby is a 68 y.o. male who recently was treated for  colitis. CT performed to diagnose colitis on 12/08/2013 showed an  incidental, roughly 13 mm exophytic enhancing mass emanating from  the posterior lower pole cortex of the right kidney. Further  evaluation by MRI demonstrated this to be a solid enhancing mass  measuring approximately 13 mm in greatest diameter. There were no  other suspicious lesions or evidence of metastatic disease in the  abdomen. The patient has been asymptomatic with regard to the renal  mass and has no history of malignancy or prior renal surgery. Following IR consultation he presents today for elective CT guided percutaneous cryoablation/possible biopsy of the right renal mass. Details/risks of procedure d/w pt/wife with their understanding and consent. Following procedure pt will be admitted for overnight observation.     Rowe Robert, PAC

## 2014-01-27 NOTE — Anesthesia Postprocedure Evaluation (Signed)
  Anesthesia Post-op Note  Patient: Eric Crosby  Procedure(s) Performed: Procedure(s) (LRB): RIGHT RENAL CRYOABLATION (Right)  Patient Location: PACU  Anesthesia Type: General  Level of Consciousness: awake and alert   Airway and Oxygen Therapy: Patient Spontanous Breathing  Post-op Pain: mild  Post-op Assessment: Post-op Vital signs reviewed, Patient's Cardiovascular Status Stable, Respiratory Function Stable, Patent Airway and No signs of Nausea or vomiting  Last Vitals:  Filed Vitals:   01/27/14 1442  BP: 150/92  Pulse:   Temp: 36.3 C  Resp:     Post-op Vital Signs: stable   Complications: No apparent anesthesia complications

## 2014-01-28 DIAGNOSIS — N289 Disorder of kidney and ureter, unspecified: Secondary | ICD-10-CM | POA: Diagnosis not present

## 2014-01-28 LAB — CBC
HCT: 46.4 % (ref 39.0–52.0)
Hemoglobin: 15.7 g/dL (ref 13.0–17.0)
MCH: 31.1 pg (ref 26.0–34.0)
MCHC: 33.8 g/dL (ref 30.0–36.0)
MCV: 91.9 fL (ref 78.0–100.0)
Platelets: 250 10*3/uL (ref 150–400)
RBC: 5.05 MIL/uL (ref 4.22–5.81)
RDW: 14.8 % (ref 11.5–15.5)
WBC: 12.9 10*3/uL — ABNORMAL HIGH (ref 4.0–10.5)

## 2014-01-28 LAB — BASIC METABOLIC PANEL
Anion gap: 13 (ref 5–15)
BUN: 15 mg/dL (ref 6–23)
CO2: 23 mEq/L (ref 19–32)
Calcium: 9.2 mg/dL (ref 8.4–10.5)
Chloride: 106 mEq/L (ref 96–112)
Creatinine, Ser: 0.91 mg/dL (ref 0.50–1.35)
GFR calc Af Amer: >90 mL/min (ref >90)
GFR calc non Af Amer: 86 mL/min — ABNORMAL LOW (ref >90)
Glucose, Bld: 154 mg/dL — ABNORMAL HIGH (ref 70–99)
Potassium: 4.4 mEq/L (ref 3.7–5.3)
Sodium: 142 mEq/L (ref 137–147)

## 2014-01-28 MED ORDER — ALUM & MAG HYDROXIDE-SIMETH 200-200-20 MG/5ML PO SUSP
15.0000 mL | ORAL | Status: DC | PRN
  Administered 2014-01-28: 15 mL via ORAL
  Filled 2014-01-28: qty 30

## 2014-01-28 NOTE — Progress Notes (Signed)
UR completed 

## 2014-01-28 NOTE — Discharge Summary (Signed)
Physician Discharge Summary  Patient ID: Eric Crosby MRN: [id number] DOB/AGE: 1946-01-28 68 y.o.  Admit date: 01/27/2014 Discharge date: 01/28/2014  Admission Diagnoses:  Rt renal mass  Discharge Diagnoses: Rt renal mass treatment- cryoablation  Active Problems: Rt renal mass Hx prostate cancer Colitis  Discharged Condition: stable  Hospital Course: Pt had symptoms of abdominal pain 11/2013. Evaluated and diagnosed with colitis.  CT at that time revealed incidental small Rt renal mass. He has hx of prostate cancer.  Referred to Dr Kathlene Cote for treatment of same with Cryoablation- which was performed 01/27/14 in IR. Pt did well with procedure and overnight admission. Denies pain; SOB; N/V. Urinating well- clear yellow. Slept well; eating well; passing gas. I have seen and examined pt. Discussed pt status with Dr Pascal Lux Plan now for dc home.   Consults: None  Significant Diagnostic Studies: CT Abdomen  Treatments: CT guided Rt renal mass cryoablation  Discharge Exam: Blood pressure 128/83, pulse 107, temperature 98.2 F (36.8 C), temperature source Oral, resp. rate 18, SpO2 95.00%.  PE:  Afeb; vss A/O; appropriate In NAD; pleasant Rt inside upper lip with new onset of small fever blister Heart: RRR Lungs: CTA Abd: CTA Rt flank site: NT; no bleeding; no hematoma Gait steady; FROM UOP great: clear yellow  Results for orders placed during the hospital encounter of 01/27/14  CBC      Result Value Ref Range   WBC 12.9 (*) 4.0 - 10.5 K/uL   RBC 5.05  4.22 - 5.81 MIL/uL   Hemoglobin 15.7  13.0 - 17.0 g/dL   HCT 46.4  39.0 - 52.0 %   MCV 91.9  78.0 - 100.0 fL   MCH 31.1  26.0 - 34.0 pg   MCHC 33.8  30.0 - 36.0 g/dL   RDW 14.8  11.5 - 15.5 %   Platelets 250  150 - 400 K/uL  BASIC METABOLIC PANEL      Result Value Ref Range   Sodium 142  137 - 147 mEq/L   Potassium 4.4  3.7 - 5.3 mEq/L   Chloride 106  96 - 112 mEq/L   CO2 23  19 - 32 mEq/L   Glucose, Bld 154 (*)  70 - 99 mg/dL   BUN 15  6 - 23 mg/dL   Creatinine, Ser 0.91  0.50 - 1.35 mg/dL   Calcium 9.2  8.4 - 10.5 mg/dL   GFR calc non Af Amer 86 (*) >90 mL/min   GFR calc Af Amer >90  >90 mL/min   Anion gap 13  5 - 15    Disposition: Rt renal cryoablation Has done well overnight No complaints Discharge home now I have discussed with Dr Pascal Lux- he is aware of pt status Continue all home meds Follow up with Dr Kathlene Cote- he will hear from Clinic with date and time He has good understanding of dc instructions  Discharge Instructions   ([place]) Call MD:  Anytime you have any of the following symptoms: 1) 3 pound weight gain in 24 hours or 5 pounds in 1 week 2) shortness of breath, with or without a dry hacking cough 3) swelling in the hands, feet or stomach 4) if you have to sleep on extra pillows at night in order to breathe.    Complete by:  As directed      Call MD for:  difficulty breathing, headache or visual disturbances    Complete by:  As directed      Call MD for:  extreme fatigue    Complete by:  As directed      Call MD for:  hives    Complete by:  As directed      Call MD for:  persistant dizziness or light-headedness    Complete by:  As directed      Call MD for:  persistant nausea and vomiting    Complete by:  As directed      Call MD for:  redness, tenderness, or signs of infection (pain, swelling, redness, odor or green/yellow discharge around incision site)    Complete by:  As directed      Call MD for:  severe uncontrolled pain    Complete by:  As directed      Call MD for:  temperature >100.4    Complete by:  As directed      Diet - low sodium heart healthy    Complete by:  As directed      Discharge instructions    Complete by:  As directed   Dc to home; follow up with Dr Kathlene Cote- pt will hear from clinic with time and date; continue all homer meds     Discharge wound care:    Complete by:  As directed   Keep band aid at Rt flank site x 1 week      Driving Restrictions    Complete by:  As directed   No driving x 3-5 days     Increase activity slowly    Complete by:  As directed      Lifting restrictions    Complete by:  As directed   No lifting over 10 lbs x 2 days     Other Restrictions    Complete by:  As directed   May shower/bathe today            Medication List         ADVIL 200 MG Caps  Generic drug:  Ibuprofen  Take 400-600 mg by mouth See admin instructions. Pt takes three tablets in the morning (600 mg) and two tablets at night (400mg )     albuterol 108 (90 BASE) MCG/ACT inhaler  Commonly known as:  PROVENTIL HFA;VENTOLIN HFA  Inhale 2 puffs into the lungs every 4 (four) hours as needed for wheezing or shortness of breath.     ALPRAZolam 0.25 MG tablet  Commonly known as:  XANAX  Take 0.125 mg by mouth at bedtime as needed for anxiety.     FISH OIL + D3 PO  Take 1 tablet by mouth. TAKES ONE EVERY OTHER DAY     levothyroxine 112 MCG tablet  Commonly known as:  SYNTHROID, LEVOTHROID  Take 112 mcg by mouth daily before breakfast.     meclizine 25 MG tablet  Commonly known as:  ANTIVERT  Take 25 mg by mouth once as needed for dizziness.     MULTIVITAMIN & MINERAL PO  Take 1 tablet by mouth every morning.     omeprazole 20 MG capsule  Commonly known as:  PRILOSEC  Take 20 mg by mouth at bedtime.     polyvinyl alcohol 1.4 % ophthalmic solution  Commonly known as:  LIQUIFILM TEARS  Place 1 drop into both eyes every 3 (three) hours.     saw palmetto 160 MG capsule  Take 160 mg by mouth at bedtime.     ST JOHNS WORT PO  Take 1 tablet by mouth every morning.  Signed: Francis Doenges A 01/28/2014, 9:24 AM

## 2014-01-28 NOTE — Progress Notes (Signed)
Patient d/c insturctions rendered,teachback utilized,verbalized understanding,patient is familiar with his home meds. Patient is stable. Bandaid applied to R flank area per order. Alert and orientedx4.- Sandie Ano RN

## 2014-01-28 NOTE — Progress Notes (Signed)
Patient d/c home,stable. Denies pain.Sandie Ano RN

## 2014-02-01 NOTE — Discharge Summary (Signed)
Agree.  For discharge.  Follow up in clinic in 4 weeks.

## 2014-02-01 NOTE — Progress Notes (Signed)
Agree.  Will follow overnight.

## 2014-02-06 ENCOUNTER — Other Ambulatory Visit: Payer: Self-pay | Admitting: Emergency Medicine

## 2014-02-06 ENCOUNTER — Other Ambulatory Visit (HOSPITAL_COMMUNITY): Payer: Self-pay | Admitting: Interventional Radiology

## 2014-02-06 DIAGNOSIS — N2889 Other specified disorders of kidney and ureter: Secondary | ICD-10-CM

## 2014-02-20 LAB — CREATININE WITH EST GFR
Creat: 1.1 mg/dL (ref 0.50–1.35)
GFR, Est African American: 80 mL/min
GFR, Est Non African American: 69 mL/min

## 2014-02-20 LAB — BUN: BUN: 17 mg/dL (ref 6–23)

## 2014-03-01 ENCOUNTER — Ambulatory Visit
Admission: RE | Admit: 2014-03-01 | Discharge: 2014-03-01 | Disposition: A | Discharge status: Home / Self Care | Payer: Commercial Managed Care - HMO | Source: Ambulatory Visit | Attending: Radiology | Admitting: Radiology

## 2014-03-01 ENCOUNTER — Ambulatory Visit (HOSPITAL_COMMUNITY)
Admission: RE | Admit: 2014-03-01 | Discharge: 2014-03-01 | Disposition: A | Discharge status: Home / Self Care | Payer: Medicare HMO | Source: Ambulatory Visit | Attending: Interventional Radiology | Admitting: Interventional Radiology

## 2014-03-01 DIAGNOSIS — N2889 Other specified disorders of kidney and ureter: Secondary | ICD-10-CM | POA: Diagnosis not present

## 2014-03-01 MED ORDER — IOHEXOL 300 MG/ML  SOLN
100.0000 mL | Freq: Once | INTRAMUSCULAR | Status: AC | PRN
  Administered 2014-03-01: 100 mL via INTRAVENOUS

## 2014-03-01 NOTE — Progress Notes (Signed)
Chief Complaint: Followup post right renal ablation.  History of Present Illness: Eric Crosby is a 68 y.o. male status percutaneous cryoablation of a right renal neoplasm on 01/27/2014.  He tolerated the procedure well without complication and left the hospital after overnight observation. Renal function has been normal and stable after the procedure with most recent creatinine of 1.1 and estimated GFR of 69 mL per minute.  Past Medical History  Diagnosis Date  . Diverticulosis     COLITIS  . OCP (ocular cicatricial pemphigoid)     IN REMISSION FOR PAST 18 YRS BUT STILL EXPERIENCE EYE PAIN DAILY AND HX OF MULTIPLE SURGERIES  . Right renal mass   . Hx of wheezing     OCCAS WHEEZING - POSS RELATED TO ALLERGIES OR ENVIRONMENTAL ISSUES-HAVE INHALER BUT HAS RARELY NEEDED  . Hypothyroidism   . Blood dyscrasia     POLYCYTHEMIA- NO MEDICATION OR TREATMENT NEEDED - PT TOLD TO MAKE EVERYONE AWARE HIS RBC CT HIGH - BUT NORMAL FOR HIM  . Prostate cancer 2011    TX'D WITH SEED IMPLANT  . Sleep apnea     PT USED CPAP FOR SEVERAL YEARS BUT IT MADE HIS EYE PROBLEM WORSE AND HE CAN NOT USE THE MACHINE NOW.  Marland Kitchen Difficult intubation     Mask ventilation with an oral airway, grade III view with a MAC 4 and unable to pass bougie, grade II view with a glidescope. Recommend using glidescope for future intubations    Past Surgical History  Procedure Laterality Date  . Colonoscopy    . Eyelid surgery      17 SURGICAL PROCEDURES BLATERAL UPPER EYE LIDS  . Neck surgery      CERVICAL FUSION - PT STATES SLIGHT LIMITED ROM  . Right retinal surgery for buckle    . Eye surgery    . Right cataract extraction with lens implant    . Seed implant for prostate cancer    . Hernia repair      UMBILICAL  AND LOWER ABDOMINAL HERNIA REPAIR  . Wisdom tooth extraction      Allergies: Sulfa antibiotics  Medications: Prior to Admission medications   Medication Sig Start Date End Date Taking? Authorizing  Provider  albuterol (PROVENTIL HFA;VENTOLIN HFA) 108 (90 BASE) MCG/ACT inhaler Inhale 2 puffs into the lungs every 4 (four) hours as needed for wheezing or shortness of breath.     Historical Provider, MD  ALPRAZolam Duanne Moron) 0.25 MG tablet Take 0.125 mg by mouth at bedtime as needed for anxiety.     Historical Provider, MD  Fish Oil-Cholecalciferol (FISH OIL + D3 PO) Take 1 tablet by mouth. TAKES ONE EVERY OTHER DAY    Historical Provider, MD  Ibuprofen (ADVIL) 200 MG CAPS Take 400-600 mg by mouth See admin instructions. Pt takes three tablets in the morning (600 mg) and two tablets at night (400mg )    Historical Provider, MD  levothyroxine (SYNTHROID, LEVOTHROID) 112 MCG tablet Take 112 mcg by mouth daily before breakfast.    Historical Provider, MD  meclizine (ANTIVERT) 25 MG tablet Take 25 mg by mouth once as needed for dizziness.    Historical Provider, MD  Multiple Vitamins-Minerals (MULTIVITAMIN & MINERAL PO) Take 1 tablet by mouth every morning.     Historical Provider, MD  omeprazole (PRILOSEC) 20 MG capsule Take 20 mg by mouth at bedtime.     Historical Provider, MD  polyvinyl alcohol (LIQUIFILM TEARS) 1.4 % ophthalmic solution Place 1 drop into both  eyes every 3 (three) hours.    Historical Provider, MD  saw palmetto 160 MG capsule Take 160 mg by mouth at bedtime.     Historical Provider, MD  ST JOHNS WORT PO Take 1 tablet by mouth every morning.     Historical Provider, MD    No family history on file.  History   Social History  . Marital Status: Married    Spouse Name: N/A    Number of Children: N/A  . Years of Education: N/A   Social History Main Topics  . Smoking status: Former Research scientist (life sciences)  . Smokeless tobacco: Never Used  . Alcohol Use: No     Comment: QUIT SMOKING 25 YRS AGO  . Drug Use: No  . Sexual Activity: Not on file   Other Topics Concern  . Not on file   Social History Narrative  . No narrative on file    ECOG Status: 0 - Asymptomatic  Review of Systems: A  12 point ROS discussed and pertinent positives are indicated in the HPI above.  All other systems are negative.  Review of Systems  Constitutional: Negative.  Negative for fever, chills and activity change.  Respiratory: Negative.   Cardiovascular: Negative.   Gastrointestinal: Negative.  Negative for nausea, vomiting, abdominal pain, diarrhea, constipation and abdominal distention.  Genitourinary: Negative.  Negative for dysuria, urgency, frequency, hematuria, flank pain and difficulty urinating.    Vital Signs: BP 121/71  Pulse 82  Temp(Src) 97.8 F (36.6 C) (Oral)  Resp 14  SpO2 95%  Physical Exam  Constitutional: He appears well-developed and well-nourished. No distress.  Abdominal: Soft. Bowel sounds are normal. He exhibits no distension and no mass. There is no tenderness. There is no rebound and no guarding.  Skin: Skin is warm and dry. No rash noted. He is not diaphoretic. No erythema.    Imaging: Ct Abd Wo & W Cm  03/01/2014   CLINICAL DATA:  One month follow-up post right renal cryoablation  EXAM: CT ABDOMEN WITHOUT AND WITH CONTRAST  TECHNIQUE: Multidetector CT imaging of the abdomen was performed following the standard protocol before and following the bolus administration of intravenous contrast.  CONTRAST:  168mL OMNIPAQUE IOHEXOL 300 MG/ML  SOLN  COMPARISON:  MRI abdomen dated 12/10/2013. CT abdomen pelvis dated 12/08/2013.  FINDINGS: Lower chest:  Lung bases are clear.  Hepatobiliary: The liver is within normal limits.  Gallbladder is unremarkable. No intrahepatic or extrahepatic ductal dilatation.  Pancreas: Within normal limits.  Spleen: Within normal limits.  Adrenals/Urinary Tract: Adrenal glands are unremarkable.  Cryoablation zone in the posterior right lower kidney measures 1.0 x 1.9 cm (series 6/image 74). Lesion is mildly hyperdense, likely hemorrhagic/proteinaceous, without convincing enhancement suggest viable tumor.  Scattered small bilateral renal cysts  measuring up to 10 mm in the left lower pole (series 8/ image 35). Left renal sinus cysts. No enhancing renal lesions. No renal calculi or hydronephrosis.  Stomach/Bowel: Stomach is notable for a small hiatal hernia.  Visualized bowel is notable for colonic diverticulosis.  Vascular/Lymphatic: Atherosclerotic calcifications of the abdominal aorta.  No suspicious abdominal lymphadenopathy.  Other: No abdominal ascites.  Musculoskeletal: Degenerative changes of the visualized thoracolumbar spine.  IMPRESSION: Status post right lower pole cryoablation, without evidence of enhancement to suggest viable tumor.   Electronically Signed   By: Julian Hy M.D.   On: 03/01/2014 10:42    Labs:  CBC:  Recent Labs  12/08/13 1320 01/20/14 1000 01/28/14 0449  WBC 13.5* 7.9 12.9*  HGB  16.9 16.7 15.7  HCT 47.9 49.3 46.4  PLT 187 240 250    COAGS:  Recent Labs  01/20/14 1000  INR 1.01  APTT 33    BMP:  Recent Labs  12/08/13 1320 01/20/14 1000 01/28/14 0449 02/06/14 1650  NA 139 142 142  --   K 3.3* 4.5 4.4  --   CL 102 106 106  --   CO2 20 25 23   --   GLUCOSE 126* 131* 154*  --   BUN 20 19 15 17   CALCIUM 9.5 9.8 9.2  --   CREATININE 1.20 0.95 0.91 1.10  GFRNONAA 61* 84* 86* 69  GFRAA 71* >90 >90 80    LIVER FUNCTION TESTS:  Recent Labs  12/08/13 1320 01/20/14 1000  BILITOT 0.6 0.6  AST 30 28  ALT 24 27  ALKPHOS 87 89  PROT 7.2 6.9  ALBUMIN 3.5 3.9    TUMOR MARKERS: No results found for this basename: AFPTM, CEA, CA199, CHROMGRNA,  in the last 8760 hours  Assessment and Plan:  Mr. Eberlin is doing well one month post right renal cryoablation. Her case biopsy was not able to be performed at the time of ablation due to the small size of the tumor.  Followup CT shows an ablation defect completely encompassing the original tumor with no evidence of contrast enhancement. No consultation is evident on CT. I recommended followup CT in March, 6 months post ablation.  I  spent a total of 20 minutes face to face in clinical consultation, greater than 50% of which was counseling/coordinating care for management post tumor ablation.   Venetia Night. Kathlene Cote, M.D. Pager:  [phone]      Signed: Aletta Edouard T 03/01/2014, 11:51 AM

## 2014-06-20 ENCOUNTER — Other Ambulatory Visit: Payer: Self-pay | Admitting: Radiology

## 2014-06-20 ENCOUNTER — Other Ambulatory Visit (HOSPITAL_COMMUNITY): Payer: Self-pay | Admitting: Interventional Radiology

## 2014-06-20 DIAGNOSIS — N2889 Other specified disorders of kidney and ureter: Secondary | ICD-10-CM

## 2014-07-03 ENCOUNTER — Other Ambulatory Visit (HOSPITAL_COMMUNITY): Payer: Self-pay | Admitting: Interventional Radiology

## 2014-07-03 LAB — CREATININE WITH EST GFR
Creat: 1 mg/dL (ref 0.50–1.35)
GFR, Est African American: 89 mL/min
GFR, Est Non African American: 77 mL/min

## 2014-07-03 LAB — BUN: BUN: 19 mg/dL (ref 6–23)

## 2014-07-11 ENCOUNTER — Encounter (HOSPITAL_COMMUNITY): Payer: Self-pay

## 2014-07-11 ENCOUNTER — Ambulatory Visit (HOSPITAL_COMMUNITY)
Admission: RE | Admit: 2014-07-11 | Discharge: 2014-07-11 | Disposition: A | Discharge status: Home / Self Care | Payer: Commercial Managed Care - HMO | Source: Ambulatory Visit | Attending: Interventional Radiology | Admitting: Interventional Radiology

## 2014-07-11 ENCOUNTER — Ambulatory Visit
Admission: RE | Admit: 2014-07-11 | Discharge: 2014-07-11 | Disposition: A | Discharge status: Home / Self Care | Payer: Commercial Managed Care - HMO | Source: Ambulatory Visit | Attending: Interventional Radiology | Admitting: Interventional Radiology

## 2014-07-11 DIAGNOSIS — N2889 Other specified disorders of kidney and ureter: Secondary | ICD-10-CM | POA: Diagnosis not present

## 2014-07-11 HISTORY — PX: IR GENERIC HISTORICAL: IMG1180011

## 2014-07-11 MED ORDER — IOHEXOL 300 MG/ML  SOLN
100.0000 mL | Freq: Once | INTRAMUSCULAR | Status: AC | PRN
Start: 2014-07-11 — End: 2014-07-11
  Administered 2014-07-11: 100 mL via INTRAVENOUS

## 2014-07-11 NOTE — Progress Notes (Signed)
Chief Complaint: Chief Complaint  Patient presents with  . Follow-up    6 mo follow up Cryoablation of Right Renal Mass    History of Present Illness: Eric Crosby is a 69 y.o. male status post percutaneous cryoablation of a right renal neoplasm on 01/27/2014. The patient has been doing well after the procedure and is without complaints. Renal function is stable and within normal limits.  Past Medical History  Diagnosis Date  . Diverticulosis     COLITIS  . OCP (ocular cicatricial pemphigoid)     IN REMISSION FOR PAST 18 YRS BUT STILL EXPERIENCE EYE PAIN DAILY AND HX OF MULTIPLE SURGERIES  . Right renal mass   . Hx of wheezing     OCCAS WHEEZING - POSS RELATED TO ALLERGIES OR ENVIRONMENTAL ISSUES-HAVE INHALER BUT HAS RARELY NEEDED  . Hypothyroidism   . Blood dyscrasia     POLYCYTHEMIA- NO MEDICATION OR TREATMENT NEEDED - PT TOLD TO MAKE EVERYONE AWARE HIS RBC CT HIGH - BUT NORMAL FOR HIM  . Prostate cancer 2011    TX'D WITH SEED IMPLANT  . Sleep apnea     PT USED CPAP FOR SEVERAL YEARS BUT IT MADE HIS EYE PROBLEM WORSE AND HE CAN NOT USE THE MACHINE NOW.  Marland Kitchen Difficult intubation     Mask ventilation with an oral airway, grade III view with a MAC 4 and unable to pass bougie, grade II view with a glidescope. Recommend using glidescope for future intubations    Past Surgical History  Procedure Laterality Date  . Colonoscopy    . Eyelid surgery      17 SURGICAL PROCEDURES BLATERAL UPPER EYE LIDS  . Neck surgery      CERVICAL FUSION - PT STATES SLIGHT LIMITED ROM  . Right retinal surgery for buckle    . Eye surgery    . Right cataract extraction with lens implant    . Seed implant for prostate cancer    . Hernia repair      UMBILICAL  AND LOWER ABDOMINAL HERNIA REPAIR  . Wisdom tooth extraction      Allergies: Sulfa antibiotics  Medications: Prior to Admission medications   Medication Sig Start Date End Date Taking? Authorizing Provider  albuterol (PROVENTIL  HFA;VENTOLIN HFA) 108 (90 BASE) MCG/ACT inhaler Inhale 2 puffs into the lungs every 4 (four) hours as needed for wheezing or shortness of breath.    Yes Historical Provider, MD  ALPRAZolam (XANAX) 0.25 MG tablet Take 0.125 mg by mouth at bedtime as needed for anxiety.    Yes Historical Provider, MD  Fish Oil-Cholecalciferol (FISH OIL + D3 PO) Take 1 tablet by mouth. TAKES ONE EVERY OTHER DAY   Yes Historical Provider, MD  Ibuprofen (ADVIL) 200 MG CAPS Take 400-600 mg by mouth See admin instructions. Pt takes three tablets in the morning (600 mg) and two tablets at night (400mg )   Yes Historical Provider, MD  levothyroxine (SYNTHROID, LEVOTHROID) 112 MCG tablet Take 112 mcg by mouth daily before breakfast.   Yes Historical Provider, MD  meclizine (ANTIVERT) 25 MG tablet Take 25 mg by mouth once as needed for dizziness.   Yes Historical Provider, MD  Multiple Vitamins-Minerals (MULTIVITAMIN & MINERAL PO) Take 1 tablet by mouth every morning.    Yes Historical Provider, MD  omeprazole (PRILOSEC) 20 MG capsule Take 20 mg by mouth at bedtime.    Yes Historical Provider, MD  polyvinyl alcohol (LIQUIFILM TEARS) 1.4 % ophthalmic solution Place 1 drop into  both eyes every 3 (three) hours.   Yes Historical Provider, MD  saw palmetto 160 MG capsule Take 160 mg by mouth at bedtime.    Yes Historical Provider, MD  ST JOHNS WORT PO Take 1 tablet by mouth every morning.    Yes Historical Provider, MD    No family history on file.  History   Social History  . Marital Status: Married    Spouse Name: N/A  . Number of Children: N/A  . Years of Education: N/A   Social History Main Topics  . Smoking status: Former Research scientist (life sciences)  . Smokeless tobacco: Never Used  . Alcohol Use: No     Comment: QUIT SMOKING 25 YRS AGO  . Drug Use: No  . Sexual Activity: Not on file   Other Topics Concern  . Not on file   Social History Narrative    Review of Systems: A 12 point ROS discussed and pertinent positives are  indicated in the HPI above.  All other systems are negative.  Review of Systems  Constitutional: Negative.   Respiratory: Negative.   Cardiovascular: Negative.   Gastrointestinal: Negative.   Genitourinary: Negative.   Musculoskeletal: Negative.   Neurological: Negative.     Vital Signs: BP 108/76 mmHg  Pulse 78  Temp(Src) 98.1 F (36.7 C) (Oral)  Resp 14  SpO2 96%  Physical Exam  Constitutional: He is oriented to person, place, and time. He appears well-developed and well-nourished. No distress.  Abdominal: Soft. He exhibits no distension and no mass. There is no tenderness. There is no rebound and no guarding.  Neurological: He is alert and oriented to person, place, and time.  Skin: He is not diaphoretic.  Nursing note and vitals reviewed.   Imaging: Ct Abd Wo & W Cm  07/11/2014   CLINICAL DATA:  Follow up right renal mass discovered last year in treated with percutaneous cryoablation 01/27/2014. Subsequent encounter.  EXAM: CT ABDOMEN WITHOUT AND WITH CONTRAST  TECHNIQUE: Multidetector CT imaging of the abdomen was performed following the standard protocol before and following the bolus administration of intravenous contrast.  CONTRAST:  135mL OMNIPAQUE IOHEXOL 300 MG/ML  SOLN  COMPARISON:  CT 12/08/2013 and 03/01/2014.  MRI 12/10/2013.  FINDINGS: Lower chest: Clear lung bases. No significant pleural or pericardial effusion.  Hepatobiliary: The liver is normal in density without focal abnormality. No evidence of gallstones, gallbladder wall thickening or biliary dilatation.  Pancreas: Fatty replaced without focal abnormality or surrounding inflammatory change.  Spleen: Normal in size without focal abnormality.  Adrenals/Urinary Tract: Both adrenal glands appear normal.Pre contrast images demonstrate tiny left renal calculi. There is no evidence of ureteral calculus or hydronephrosis. There has been further contraction of the cryoablation zone posteriorly in the interpolar region of  the right kidney. This now measures 13 x 13 mm and demonstrates no suspicious enhancement. The stranding in the adjacent posterior perinephric fat is stable. Small renal cortical cysts and left renal sinus cysts are unchanged. There is no evidence of enhancing mass. Delayed images result in segmental visualization of the ureters. No urothelial abnormalities are identified.  Stomach/Bowel: No evidence of bowel wall thickening, distention or surrounding inflammatory change.  Vascular/Lymphatic: There are no enlarged abdominal lymph nodes. Stable aortoiliac atherosclerosis.  Other: No evidence of abdominal wall mass or hernia.  Musculoskeletal: No acute or significant osseous findings.  IMPRESSION: 1. Further contraction of the cryoablation zone in the posterior right kidney. No evidence of local recurrence or metastatic disease. 2. Stable renal cortical and sinus  cysts. Nonobstructing left renal calculi. 3. No acute findings.   Electronically Signed   By: Richardean Sale M.D.   On: 07/11/2014 09:24    Labs:  CBC:  Recent Labs  12/08/13 1320 01/20/14 1000 01/28/14 0449  WBC 13.5* 7.9 12.9*  HGB 16.9 16.7 15.7  HCT 47.9 49.3 46.4  PLT 187 240 250    COAGS:  Recent Labs  01/20/14 1000  INR 1.01  APTT 33    BMP:  Recent Labs  12/08/13 1320 01/20/14 1000 01/28/14 0449 02/06/14 1650 07/03/14 1413  NA 139 142 142  --   --   K 3.3* 4.5 4.4  --   --   CL 102 106 106  --   --   CO2 20 25 23   --   --   GLUCOSE 126* 131* 154*  --   --   BUN 20 19 15 17 19   CALCIUM 9.5 9.8 9.2  --   --   CREATININE 1.20 0.95 0.91 1.10 1.00  GFRNONAA 61* 84* 86* 69 77  GFRAA 71* >90 >90 80 89    LIVER FUNCTION TESTS:  Recent Labs  12/08/13 1320 01/20/14 1000  BILITOT 0.6 0.6  AST 30 28  ALT 24 27  ALKPHOS 87 89  PROT 7.2 6.9  ALBUMIN 3.5 3.9    TUMOR MARKERS: No results for input(s): AFPTM, CEA, CA199, CHROMGRNA in the last 8760 hours.  Assessment and Plan:  Imaging shows further  retraction of the cryoablation defect at the level of the treated posterior right interpolar renal neoplasm. There is no evidence of residual enhancing tumor. No evidence of complication 6 months after treatment. I recommended follow-up imaging in September, 1 year status post treatment. The patient is agreeable. I will meet with Eric Crosby at that time to review imaging.  SignedAletta Edouard T 07/11/2014, 2:29 PM     I spent a total of 20 minutes face to face in clinical consultation, greater than 50% of which was counseling/coordinating care post right renal tumor cryoablation.

## 2015-01-17 ENCOUNTER — Other Ambulatory Visit: Payer: Self-pay | Admitting: Radiology

## 2015-01-17 ENCOUNTER — Other Ambulatory Visit (HOSPITAL_COMMUNITY): Payer: Self-pay | Admitting: Interventional Radiology

## 2015-01-17 DIAGNOSIS — N2889 Other specified disorders of kidney and ureter: Secondary | ICD-10-CM

## 2015-02-12 LAB — CREATININE WITH EST GFR
Creat: 1.1 mg/dL (ref 0.70–1.25)
GFR, Est African American: 79 mL/min (ref >=60)
GFR, Est Non African American: 69 mL/min (ref >=60)

## 2015-02-12 LAB — BUN: BUN: 24 mg/dL (ref 7–25)

## 2015-02-27 ENCOUNTER — Other Ambulatory Visit: Payer: Self-pay | Admitting: *Deleted

## 2015-02-27 DIAGNOSIS — N2889 Other specified disorders of kidney and ureter: Secondary | ICD-10-CM

## 2015-03-01 ENCOUNTER — Ambulatory Visit (HOSPITAL_COMMUNITY)
Admission: RE | Admit: 2015-03-01 | Discharge: 2015-03-01 | Disposition: A | Discharge status: Home / Self Care | Payer: Commercial Managed Care - HMO | Source: Ambulatory Visit | Attending: Interventional Radiology | Admitting: Interventional Radiology

## 2015-03-01 ENCOUNTER — Ambulatory Visit
Admission: RE | Admit: 2015-03-01 | Discharge: 2015-03-01 | Disposition: A | Discharge status: Home / Self Care | Payer: Commercial Managed Care - HMO | Source: Ambulatory Visit | Attending: Interventional Radiology | Admitting: Interventional Radiology

## 2015-03-01 ENCOUNTER — Encounter (HOSPITAL_COMMUNITY): Payer: Self-pay

## 2015-03-01 DIAGNOSIS — N2889 Other specified disorders of kidney and ureter: Secondary | ICD-10-CM | POA: Diagnosis present

## 2015-03-01 DIAGNOSIS — K449 Diaphragmatic hernia without obstruction or gangrene: Secondary | ICD-10-CM | POA: Insufficient documentation

## 2015-03-01 DIAGNOSIS — I7090 Unspecified atherosclerosis: Secondary | ICD-10-CM | POA: Diagnosis not present

## 2015-03-01 DIAGNOSIS — Z09 Encounter for follow-up examination after completed treatment for conditions other than malignant neoplasm: Secondary | ICD-10-CM | POA: Diagnosis not present

## 2015-03-01 MED ORDER — IOHEXOL 300 MG/ML  SOLN
100.0000 mL | Freq: Once | INTRAMUSCULAR | Status: AC | PRN
  Administered 2015-03-01: 100 mL via INTRAVENOUS

## 2015-03-01 NOTE — Progress Notes (Signed)
Chief Complaint: Status post percutaneous cryoablation of a right renal neoplasm on 01/27/2014.  History of Present Illness: Eric Crosby is a 69 y.o. male status post cryoablation of a small enhancing right renal tumor 2 years ago. He has been doing very well with no complaints or symptoms.  Past Medical History  Diagnosis Date  . Diverticulosis     COLITIS  . OCP (ocular cicatricial pemphigoid)     IN REMISSION FOR PAST 18 YRS BUT STILL EXPERIENCE EYE PAIN DAILY AND HX OF MULTIPLE SURGERIES  . Right renal mass   . Hx of wheezing     OCCAS WHEEZING - POSS RELATED TO ALLERGIES OR ENVIRONMENTAL ISSUES-HAVE INHALER BUT HAS RARELY NEEDED  . Hypothyroidism   . Blood dyscrasia     POLYCYTHEMIA- NO MEDICATION OR TREATMENT NEEDED - PT TOLD TO MAKE EVERYONE AWARE HIS RBC CT HIGH - BUT NORMAL FOR HIM  . Prostate cancer ([place]) 2011    TX'D WITH SEED IMPLANT  . Sleep apnea     PT USED CPAP FOR SEVERAL YEARS BUT IT MADE HIS EYE PROBLEM WORSE AND HE CAN NOT USE THE MACHINE NOW.  Marland Kitchen Difficult intubation     Mask ventilation with an oral airway, grade III view with a MAC 4 and unable to pass bougie, grade II view with a glidescope. Recommend using glidescope for future intubations    Past Surgical History  Procedure Laterality Date  . Colonoscopy    . Eyelid surgery      17 SURGICAL PROCEDURES BLATERAL UPPER EYE LIDS  . Neck surgery      CERVICAL FUSION - PT STATES SLIGHT LIMITED ROM  . Right retinal surgery for buckle    . Eye surgery    . Right cataract extraction with lens implant    . Seed implant for prostate cancer    . Hernia repair      UMBILICAL  AND LOWER ABDOMINAL HERNIA REPAIR  . Wisdom tooth extraction      Allergies: Sulfa antibiotics  Medications: Prior to Admission medications   Medication Sig Start Date End Date Taking? Authorizing Provider  albuterol (PROVENTIL HFA;VENTOLIN HFA) 108 (90 BASE) MCG/ACT inhaler Inhale 2 puffs into the lungs every 4 (four)  hours as needed for wheezing or shortness of breath.    Yes Historical Provider, MD  ALPRAZolam (XANAX) 0.25 MG tablet Take 0.125 mg by mouth at bedtime as needed for anxiety.    Yes Historical Provider, MD  Fish Oil-Cholecalciferol (FISH OIL + D3 PO) Take 1 tablet by mouth. TAKES ONE EVERY OTHER DAY   Yes Historical Provider, MD  Ibuprofen (ADVIL) 200 MG CAPS Take 400-600 mg by mouth See admin instructions. Pt takes three tablets in the morning (600 mg) and two tablets at night (436m)   Yes Historical Provider, MD  levothyroxine (SYNTHROID, LEVOTHROID) 112 MCG tablet Take 112 mcg by mouth daily before breakfast.   Yes Historical Provider, MD  meclizine (ANTIVERT) 25 MG tablet Take 25 mg by mouth once as needed for dizziness.   Yes Historical Provider, MD  Multiple Vitamins-Minerals (MULTIVITAMIN & MINERAL PO) Take 1 tablet by mouth every morning.    Yes Historical Provider, MD  omeprazole (PRILOSEC) 20 MG capsule Take 20 mg by mouth at bedtime.    Yes Historical Provider, MD  polyvinyl alcohol (LIQUIFILM TEARS) 1.4 % ophthalmic solution Place 1 drop into both eyes every 3 (three) hours.   Yes Historical Provider, MD  saw palmetto 160 MG capsule  Take 160 mg by mouth at bedtime.    Yes Historical Provider, MD  ST JOHNS WORT PO Take 1 tablet by mouth every morning.    Yes Historical Provider, MD     No family history on file.  Social History   Social History  . Marital Status: Married    Spouse Name: N/A  . Number of Children: N/A  . Years of Education: N/A   Social History Main Topics  . Smoking status: Former Research scientist (life sciences)  . Smokeless tobacco: Never Used  . Alcohol Use: No     Comment: QUIT SMOKING 25 YRS AGO  . Drug Use: No  . Sexual Activity: Not on file   Other Topics Concern  . Not on file   Social History Narrative    Review of Systems: A 12 point ROS discussed and pertinent positives are indicated in the HPI above.  All other systems are negative.  Review of Systems    Constitutional: Negative.   Respiratory: Negative.   Cardiovascular: Negative.   Gastrointestinal: Negative.   Genitourinary: Negative.   Musculoskeletal: Negative.   Neurological: Negative.     Vital Signs: BP 140/81 mmHg  Pulse 83  Temp(Src) 98.1 F (36.7 C) (Oral)  Resp 14  SpO2 99%  Physical Exam  Constitutional: He is oriented to person, place, and time. No distress.  Abdominal: Soft. He exhibits no distension. There is no tenderness.  Neurological: He is alert and oriented to person, place, and time.  Skin: He is not diaphoretic.  Nursing note and vitals reviewed.   Mallampati Score:     Imaging: Ct Abd Wo & W Cm  03/01/2015  CLINICAL DATA:  One year follow up for right renal cryoablation. History of prostate cancer with seed implants. EXAM: CT ABDOMEN WITHOUT AND WITH CONTRAST TECHNIQUE: Multidetector CT imaging of the abdomen was performed following the standard protocol before and following the bolus administration of intravenous contrast. CONTRAST:  129m OMNIPAQUE IOHEXOL 300 MG/ML  SOLN COMPARISON:  Multiple exams, including 07/11/2014 FINDINGS: Lower chest:  Small type 1 hiatal hernia. Hepatobiliary: From unremarkable Pancreas: Several punctate calcifications along the pancreatic head are likely postinflammatory from remote inflammation. Spleen: Unremarkable Adrenals/Urinary Tract: 2 mm nonobstructive left mid kidney calculus, image 61 series 2. Left extrarenal pelvis. Left kidney upper pole hypodense lesion 0.8 cm on image 47 series 6, likely a cyst although technically too small to characterize. 9 mm In the left kidney lower pole, there is a 9 mm exophytic enhancing lesion which appears to have grown over the last dear in. This increases from 11 Hounsfield units precontrast to 99 Hounsfield units on arterial phase, 83 on portal venous phase, and 31 on delayed phase. This is probably a small left kidney lower pole renal cell carcinoma. At the cryoablation site in the  right kidney, there is no recurrent tumor. Typical cryoablation halo observed. Stomach/Bowel: Unremarkable Vascular/Lymphatic: Aortoiliac atherosclerotic vascular disease. No pathologic adenopathy identified. No tumor thrombus in the renal veins. Other: No supplemental non-categorized findings. Musculoskeletal: Lumbar spondylosis and degenerative disc disease. IMPRESSION: 1. 9 mm abnormal enhancing exophytic mass from the left kidney lower pole. This is contralateral to the prior tumor. 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 2. No recurrence of malignancy at the right renal cryoablation site. 3. Small type 1 hiatal hernia. 4.  Aortoiliac atherosclerotic vascular disease. Electronically Signed   By: WVan ClinesM.D.   On: 03/01/2015 10:22    Labs:  CBC: No results for input(s): WBC, HGB,  HCT, PLT in the last 8760 hours.  COAGS: No results for input(s): INR, APTT in the last 8760 hours.  BMP:  Recent Labs  07/03/14 1413 02/12/15 2220  BUN 19 24  CREATININE 1.00 1.10  GFRNONAA 77 69  GFRAA 89 79    LIVER FUNCTION TESTS: No results for input(s): BILITOT, AST, ALT, ALKPHOS, PROT, ALBUMIN in the last 8760 hours.  TUMOR MARKERS: No results for input(s): AFPTM, CEA, CA199, CHROMGRNA in the last 8760 hours.  Assessment and Plan:  I met with Mr. Pecore and reviewed the follow-up CT of the abdomen performed today. This demonstrates a stable ablation defect in the posterior and lower aspect of the right kidney with no evidence of recurrent enhancing tumor. There is a small exophytic lesion emanating from the posterior lower pole cortex of the left kidney. By my measurement, this measures approximately 8 mm in diameter. In retrospect, this is also visible on the prior CT in March and measured approximately 7-8 millimeters at that time. This likely has enlarged slowly over time with a diameter of roughly 6-7 mm on the October, 2015 scan. This may represent a very slow-growing  carcinoma.  I did not currently recommend that we treat the small left renal lesion. We discussed the possibility of future cryoablation. I recommended that we only treat this lesion if it clearly demonstrates significant growth over time. It is still very small and would be very easy to treat even if it more than doubles in size.  I recommended a follow-up CT in one year. The patient is agreeable.  SignedAletta Edouard T 03/01/2015, 5:24 PM     I spent a total of 15 Minutes in face to face in clinical consultation, greater than 50% of which was counseling/coordinating care after right renal ablation.

## 2015-12-11 DEATH — deceased

## 2016-02-21 ENCOUNTER — Other Ambulatory Visit (HOSPITAL_COMMUNITY): Payer: Self-pay | Admitting: Interventional Radiology

## 2016-02-21 DIAGNOSIS — N2889 Other specified disorders of kidney and ureter: Secondary | ICD-10-CM

## 2016-03-25 ENCOUNTER — Encounter (HOSPITAL_COMMUNITY): Payer: Self-pay

## 2016-03-25 ENCOUNTER — Ambulatory Visit
Admission: RE | Admit: 2016-03-25 | Discharge: 2016-03-25 | Disposition: A | Discharge status: Home / Self Care | Payer: Commercial Managed Care - HMO | Source: Ambulatory Visit | Attending: Interventional Radiology | Admitting: Interventional Radiology

## 2016-03-25 ENCOUNTER — Ambulatory Visit (HOSPITAL_COMMUNITY)
Admission: RE | Admit: 2016-03-25 | Discharge: 2016-03-25 | Disposition: A | Discharge status: Home / Self Care | Payer: Commercial Managed Care - HMO | Source: Ambulatory Visit | Attending: Interventional Radiology | Admitting: Interventional Radiology

## 2016-03-25 DIAGNOSIS — N2889 Other specified disorders of kidney and ureter: Secondary | ICD-10-CM

## 2016-03-25 DIAGNOSIS — K5730 Diverticulosis of large intestine without perforation or abscess without bleeding: Secondary | ICD-10-CM | POA: Insufficient documentation

## 2016-03-25 DIAGNOSIS — N200 Calculus of kidney: Secondary | ICD-10-CM | POA: Insufficient documentation

## 2016-03-25 DIAGNOSIS — I700 Atherosclerosis of aorta: Secondary | ICD-10-CM | POA: Diagnosis not present

## 2016-03-25 DIAGNOSIS — K449 Diaphragmatic hernia without obstruction or gangrene: Secondary | ICD-10-CM | POA: Insufficient documentation

## 2016-03-25 HISTORY — PX: IR GENERIC HISTORICAL: IMG1180011

## 2016-03-25 LAB — POCT I-STAT CREATININE: Creatinine, Ser: 0.9 mg/dL (ref 0.61–1.24)

## 2016-03-25 MED ORDER — IOPAMIDOL (ISOVUE-300) INJECTION 61%
100.0000 mL | Freq: Once | INTRAVENOUS | Status: AC | PRN
  Administered 2016-03-25: 100 mL via INTRAVENOUS

## 2016-03-25 NOTE — Progress Notes (Signed)
Chief Complaint: Status post cryoablation of a right renal neoplasm on 01/27/2014. More recent detection of new enhancing mass of the left kidney.  History of Present Illness: Eric Crosby has been doing well and is without complaints. He recently sold his home and moved with his wife into a condominium.  Past Medical History:  Diagnosis Date  . Blood dyscrasia    POLYCYTHEMIA- NO MEDICATION OR TREATMENT NEEDED - PT TOLD TO MAKE EVERYONE AWARE HIS RBC CT HIGH - BUT NORMAL FOR HIM  . Difficult intubation    Mask ventilation with an oral airway, grade III view with a MAC 4 and unable to pass bougie, grade II view with a glidescope. Recommend using glidescope for future intubations  . Diverticulosis    COLITIS  . Hx of wheezing    OCCAS WHEEZING - POSS RELATED TO ALLERGIES OR ENVIRONMENTAL ISSUES-HAVE INHALER BUT HAS RARELY NEEDED  . Hypothyroidism   . 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 Prostate cancer ([place]) 2011   TX'D WITH SEED IMPLANT  . Right renal mass   . Sleep apnea    PT USED CPAP FOR SEVERAL YEARS BUT IT MADE HIS EYE PROBLEM WORSE AND HE CAN NOT USE THE MACHINE NOW.    Past Surgical History:  Procedure Laterality Date  . colonoscopy    . EYE SURGERY    . eyelid surgery     18 SURGICAL PROCEDURES BLATERAL UPPER EYE LIDS  . HERNIA REPAIR     UMBILICAL  AND LOWER ABDOMINAL HERNIA REPAIR  . NECK SURGERY     CERVICAL FUSION - PT STATES SLIGHT LIMITED ROM  . RIGHT CATARACT EXTRACTION WITH LENS IMPLANT    . RIGHT RETINAL SURGERY FOR BUCKLE    . SEED IMPLANT FOR PROSTATE CANCER    . WISDOM TOOTH EXTRACTION      Allergies: Sulfa antibiotics  Medications: Prior to Admission medications   Medication Sig Start Date End Date Taking? Authorizing Provider  albuterol (PROVENTIL HFA;VENTOLIN HFA) 108 (90 BASE) MCG/ACT inhaler Inhale 2 puffs into the lungs every 4 (four)  hours as needed for wheezing or shortness of breath.    Yes Historical Provider, MD  ALPRAZolam (XANAX) 0.25 MG tablet Take 0.125 mg by mouth at bedtime as needed for anxiety.    Yes Historical Provider, MD  Ibuprofen (ADVIL) 200 MG CAPS Take 400-600 mg by mouth See admin instructions. Pt takes three tablets in the morning (600 mg) and two tablets at night (457m)   Yes Historical Provider, MD  levothyroxine (SYNTHROID, LEVOTHROID) 112 MCG tablet Take 112 mcg by mouth daily before breakfast.   Yes Historical Provider, MD  meclizine (ANTIVERT) 25 MG tablet Take 25 mg by mouth once as needed for dizziness.   Yes Historical Provider, MD  Multiple Vitamins-Minerals (MULTIVITAMIN & MINERAL PO) Take 1 tablet by mouth every morning.    Yes Historical Provider, MD  omeprazole (PRILOSEC) 20 MG capsule Take 20 mg by mouth at bedtime.    Yes Historical Provider, MD  polyvinyl alcohol (LIQUIFILM TEARS) 1.4 % ophthalmic solution Place 1 drop into both eyes every 3 (three) hours.   Yes Historical Provider, MD  saw palmetto 160 MG capsule Take 160 mg by mouth at bedtime.    Yes Historical Provider, MD  Fish Oil-Cholecalciferol (FISH OIL + D3 PO) Take 1 tablet by mouth. TAKES ONE EVERY OTHER DAY  Historical Provider, MD  [employer] JOHNS WORT PO Take 1 tablet by mouth every morning.     Historical Provider, MD     No family history on file.  Social History   Social History  . Marital status: Married    Spouse name: N/A  . Number of children: N/A  . Years of education: N/A   Social History Main Topics  . Smoking status: Former Research scientist (life sciences)  . Smokeless tobacco: Never Used  . Alcohol use No     Comment: QUIT SMOKING 25 YRS AGO  . Drug use: No  . Sexual activity: Not on file   Other Topics Concern  . Not on file   Social History Narrative  . No narrative on file    Review of Systems: A 12 point ROS discussed and pertinent positives are indicated in the HPI above.  All other systems are negative.  Review of  Systems  Constitutional: Negative.   Respiratory: Negative.   Cardiovascular: Negative.   Gastrointestinal: Negative.   Genitourinary: Negative.   Musculoskeletal: Negative.   Neurological: Negative.     Vital Signs: BP (!) 142/87 (BP Location: Left Arm, Patient Position: Sitting, Cuff Size: Normal)   Pulse 85   Temp 97.8 F (36.6 C) (Oral)   Resp 14   Ht 6' 2"  (1.88 m)   Wt 235 lb (106.6 kg)   SpO2 94%   BMI 30.17 kg/m   Physical Exam  Constitutional: He is oriented to person, place, and time. No distress.  Abdominal: Soft. He exhibits no distension and no mass. There is no tenderness. There is no rebound and no guarding.  Neurological: He is alert and oriented to person, place, and time.  Skin: He is not diaphoretic.  Nursing note and vitals reviewed.   Imaging: Ct Abdomen W Wo Contrast  Result Date: 03/25/2016 CLINICAL DATA:  Two year follow-up after cryoablation of right renal mass. EXAM: CT ABDOMEN WITHOUT AND WITH CONTRAST TECHNIQUE: Multidetector CT imaging of the abdomen was performed following the standard protocol before and following the bolus administration of intravenous contrast. CONTRAST:  124m ISOVUE-300 IOPAMIDOL (ISOVUE-300) INJECTION 61% COMPARISON:  03/01/2015 FINDINGS: Lower chest: Clear lung bases. Normal heart size without pericardial or pleural effusion. Small hiatal hernia. Hepatobiliary: Normal liver. Normal gallbladder, without biliary ductal dilatation. Pancreas: Fatty replacement involving the pancreatic neck, head, and uncinate process. No duct dilatation or acute pancreatitis. Periampullary duodenal diverticulum is tiny. Spleen: Normal in size, without focal abnormality. Adrenals/Urinary Tract: Normal adrenal glands. Punctate interpolar left renal collecting system calculus. Too small to characterize lesions in both kidneys. Posterior interpolar right renal ablation site measures on the order of 2.0 x 1.3 cm versus 2.5 x 1.7 cm on the prior exam (when  remeasured). No locally recurrent disease Left renal sinus cysts. A lower pole left renal 1.4 cm lesion on image 74/series 4 demonstrates postcontrast enhancement, including on image 74/ series 9. This is enlarged from 9 mm on the prior exam. Stomach/Bowel: Normal remainder of the stomach. Scattered colonic diverticula. Otherwise normal small bowel. Vascular/Lymphatic: Aortic and branch vessel atherosclerosis. Patent renal veins. No retroperitoneal or retrocrural adenopathy. Other: No ascites. Musculoskeletal: No acute osseous abnormality. IMPRESSION: 1. Further retraction of right-sided ablation site, without locally recurrent or metastatic disease. 2. Interval enlargement of a lower pole left renal lesion which is consistent with renal cell carcinoma. 3. Hiatal hernia. 4.  Aortic atherosclerosis. 5. Left nephrolithiasis. Electronically Signed   By: KAbigail MiyamotoM.D.   On: 03/25/2016 10:20  Labs:  CBC: No results for input(s): WBC, HGB, HCT, PLT in the last 8760 hours.  COAGS: No results for input(s): INR, APTT in the last 8760 hours.  BMP:  Recent Labs  03/25/16 0858  CREATININE 0.90    Assessment and Plan:  I met with Eric Crosby and reviewed the follow-up CT scan performed today. This demonstrates some interval enlargement of a solid, enhancing tumor emanating from the posterior lower pole of the left kidney and showing predominantly exophytic growth. Based on my measurements, this has increased from approximately 8-9 millimeters in diameter on the 03/01/2015 study to approximately 13 mm in maximum diameter on the current CT. Findings are consistent with a small renal carcinoma. Current scan shows further retraction of ablation defect of the posterior contralateral right kidney with no evidence of recurrent or enhancing tumor. There is no evidence of metastatic disease in the abdomen.  The enlarging left lower pole renal lesion is consistent with a renal carcinoma. This is still quite  small and very amenable to percutaneous ablation. After discussing options, Eric Crosby would like to proceed with ablation given that the tumor is clearly enlarging. He is scheduled to change insurance companies on January 1 and would like to schedule ablation for after the first of the year.  Electronically SignedAletta Edouard T 03/25/2016, 11:26 AM     I spent a total of 15 Minutes in face to face in clinical consultation, greater than 50% of which was counseling/coordinating care post ablation of a right renal carcinoma and follow-up of an enlarging left renal mass.

## 2016-04-01 ENCOUNTER — Encounter: Payer: Self-pay | Admitting: Interventional Radiology

## 2016-04-16 ENCOUNTER — Other Ambulatory Visit: Payer: Self-pay | Admitting: Interventional Radiology

## 2016-04-16 DIAGNOSIS — N2889 Other specified disorders of kidney and ureter: Secondary | ICD-10-CM

## 2016-05-09 NOTE — Progress Notes (Signed)
Scheduling pre op-- PLEASE PLACE ORDERS IN EPIC--thanks

## 2016-05-14 ENCOUNTER — Encounter: Payer: Self-pay | Admitting: Interventional Radiology

## 2016-06-03 ENCOUNTER — Encounter (HOSPITAL_COMMUNITY): Payer: Self-pay

## 2016-06-03 ENCOUNTER — Other Ambulatory Visit: Payer: Self-pay | Admitting: Radiology

## 2016-06-03 NOTE — Patient Instructions (Addendum)
Eric Crosby  06/03/2016   Your procedure is scheduled on: 06/06/2016  Report to    [facility] Main  Entrance and check in at Radiology first then  take [facility]  elevators to 3rd floor to  [place] at     [place].  Call this number if you have problems the morning of surgery [phone]   Remember: ONLY 1 PERSON MAY GO WITH YOU TO SHORT STAY TO GET  READY MORNING OF [place].  Do not eat food or drink liquids :After Midnight.     Take these medicines the morning of surgery with A SIP OF WATER:  Albuterol if needed and bring,  Eye drops if needed, synthroid, meclizine if needed                                You may not have any metal on your body including hair pins and              piercings  Do not wear jewelry,  lotions, powders or perfumes, deododerant              Men may shave face and neck.   Do not bring valuables to the hospital. [place].  Contacts, dentures or bridgework may not be worn into surgery.  Leave suitcase in the car. After surgery it may be brought to your room.                 Please read over the following fact sheets you were given: _____________________________________________________________________             [facility] - Preparing for Surgery Before surgery, you can play an important role.  Because skin is not sterile, your skin needs to be as free of germs as possible.  You can reduce the number of germs on your skin by washing with CHG (chlorahexidine gluconate) soap before surgery.  CHG is an antiseptic cleaner which kills germs and bonds with the skin to continue killing germs even after washing. Please DO NOT use if you have an allergy to CHG or antibacterial soaps.  If your skin becomes reddened/irritated stop using the CHG and inform your nurse when you arrive at Short Stay. Do not shave (including legs and underarms) for at least 48 hours prior to the  first CHG shower.  You may shave your face/neck. Please follow these instructions carefully:  1.  Shower with CHG Soap the night before surgery and the  morning of Surgery.  2.  If you choose to wash your hair, wash your hair first as usual with your  normal  shampoo.  3.  After you shampoo, rinse your hair and body thoroughly to remove the  shampoo.                           4.  Use CHG as you would any other liquid soap.  You can apply chg directly  to the skin and wash                       Gently with a scrungie or clean washcloth.  5.  Apply the CHG  Soap to your body ONLY FROM THE NECK DOWN.   Do not use on face/ open                           Wound or open sores. Avoid contact with eyes, ears mouth and genitals (private parts).                       Wash face,  Genitals (private parts) with your normal soap.             6.  Wash thoroughly, paying special attention to the area where your surgery  will be performed.  7.  Thoroughly rinse your body with warm water from the neck down.  8.  DO NOT shower/wash with your normal soap after using and rinsing off  the CHG Soap.                9.  Pat yourself dry with a clean towel.            10.  Wear clean pajamas.            11.  Place clean sheets on your bed the night of your first shower and do not  sleep with pets. Day of Surgery : Do not apply any lotions/deodorants the morning of surgery.  Please wear clean clothes to the hospital/surgery center.  FAILURE TO FOLLOW THESE INSTRUCTIONS MAY RESULT IN THE CANCELLATION OF YOUR SURGERY PATIENT SIGNATURE_________________________________  NURSE SIGNATURE__________________________________  ________________________________________________________________________  WHAT IS A BLOOD TRANSFUSION? Blood Transfusion Information  A transfusion is the replacement of blood or some of its parts. Blood is made up of multiple cells which provide different functions.  Red blood cells carry oxygen and are  used for blood loss replacement.  White blood cells fight against infection.  Platelets control bleeding.  Plasma helps clot blood.  Other blood products are available for specialized needs, such as hemophilia or other clotting disorders. BEFORE THE TRANSFUSION  Who gives blood for transfusions?   Healthy volunteers who are fully evaluated to make sure their blood is safe. This is blood bank blood. Transfusion therapy is the safest it has ever been in the practice of medicine. Before blood is taken from a donor, a complete history is taken to make sure that person has no history of diseases nor engages in risky social behavior (examples are intravenous drug use or sexual activity with multiple partners). The donor's travel history is screened to minimize risk of transmitting infections, such as malaria. The donated blood is tested for signs of infectious diseases, such as HIV and hepatitis. The blood is then tested to be sure it is compatible with you in order to minimize the chance of a transfusion reaction. If you or a relative donates blood, this is often done in anticipation of surgery and is not appropriate for emergency situations. It takes many days to process the donated blood. RISKS AND COMPLICATIONS Although transfusion therapy is very safe and saves many lives, the main dangers of transfusion include:   Getting an infectious disease.  Developing a transfusion reaction. This is an allergic reaction to something in the blood you were given. Every precaution is taken to prevent this. The decision to have a blood transfusion has been considered carefully by your caregiver before blood is given. Blood is not given unless the benefits outweigh the risks. AFTER THE TRANSFUSION  Right after receiving a blood transfusion, you  will usually feel much better and more energetic. This is especially true if your red blood cells have gotten low (anemic). The transfusion raises the level of the red  blood cells which carry oxygen, and this usually causes an energy increase.  The nurse administering the transfusion will monitor you carefully for complications. HOME CARE INSTRUCTIONS  No special instructions are needed after a transfusion. You may find your energy is better. Speak with your caregiver about any limitations on activity for underlying diseases you may have. SEEK MEDICAL CARE IF:   Your condition is not improving after your transfusion.  You develop redness or irritation at the intravenous (IV) site. SEEK IMMEDIATE MEDICAL CARE IF:  Any of the following symptoms occur over the next 12 hours:  Shaking chills.  You have a temperature by mouth above 102 F (38.9 C), not controlled by medicine.  Chest, back, or muscle pain.  People around you feel you are not acting correctly or are confused.  Shortness of breath or difficulty breathing.  Dizziness and fainting.  You get a rash or develop hives.  You have a decrease in urine output.  Your urine turns a dark color or changes to pink, red, or brown. Any of the following symptoms occur over the next 10 days:  You have a temperature by mouth above 102 F (38.9 C), not controlled by medicine.  Shortness of breath.  Weakness after normal activity.  The white part of the eye turns yellow (jaundice).  You have a decrease in the amount of urine or are urinating less often.  Your urine turns a dark color or changes to pink, red, or brown. Document Released: 04/25/2000 Document Revised: 07/21/2011 Document Reviewed: 12/13/2007 [facility] Patient Information 2014 [place], [place].  _______________________________________________________________________

## 2016-06-04 ENCOUNTER — Encounter (HOSPITAL_COMMUNITY)
Admission: RE | Admit: 2016-06-04 | Discharge: 2016-06-04 | Disposition: A | Discharge status: Home / Self Care | Payer: Medicare HMO | Source: Ambulatory Visit | Attending: Interventional Radiology | Admitting: Interventional Radiology

## 2016-06-04 ENCOUNTER — Encounter (HOSPITAL_COMMUNITY): Payer: Self-pay

## 2016-06-04 ENCOUNTER — Ambulatory Visit (HOSPITAL_COMMUNITY)
Admission: RE | Admit: 2016-06-04 | Discharge: 2016-06-04 | Disposition: A | Discharge status: Home / Self Care | Payer: Medicare HMO | Source: Ambulatory Visit | Attending: Radiology | Admitting: Radiology

## 2016-06-04 DIAGNOSIS — Z01818 Encounter for other preprocedural examination: Secondary | ICD-10-CM | POA: Insufficient documentation

## 2016-06-04 DIAGNOSIS — Z01812 Encounter for preprocedural laboratory examination: Secondary | ICD-10-CM | POA: Insufficient documentation

## 2016-06-04 DIAGNOSIS — Z01810 Encounter for preprocedural cardiovascular examination: Secondary | ICD-10-CM | POA: Insufficient documentation

## 2016-06-04 HISTORY — DX: Unspecified asthma, uncomplicated: J45.909

## 2016-06-04 HISTORY — DX: Bipolar disorder, unspecified: F31.9

## 2016-06-04 HISTORY — DX: Adverse effect of unspecified anesthetic, initial encounter: T41.45XA

## 2016-06-04 HISTORY — DX: Anxiety disorder, unspecified: F41.9

## 2016-06-04 HISTORY — DX: Other complications of anesthesia, initial encounter: T88.59XA

## 2016-06-04 HISTORY — DX: Unspecified osteoarthritis, unspecified site: M19.90

## 2016-06-04 HISTORY — DX: Personal history of urinary calculi: Z87.442

## 2016-06-04 LAB — PROTIME-INR
INR: 1.02
Prothrombin Time: 13.4 seconds (ref 11.4–15.2)

## 2016-06-04 LAB — CBC WITH DIFFERENTIAL/PLATELET
Basophils Absolute: 0 10*3/uL (ref 0.0–0.1)
Basophils Relative: 0 %
Eosinophils Absolute: 0.3 10*3/uL (ref 0.0–0.7)
Eosinophils Relative: 3 %
HCT: 48.1 % (ref 39.0–52.0)
Hemoglobin: 16.5 g/dL (ref 13.0–17.0)
Lymphocytes Relative: 31 %
Lymphs Abs: 2.6 10*3/uL (ref 0.7–4.0)
MCH: 31.3 pg (ref 26.0–34.0)
MCHC: 34.3 g/dL (ref 30.0–36.0)
MCV: 91.3 fL (ref 78.0–100.0)
Monocytes Absolute: 0.8 10*3/uL (ref 0.1–1.0)
Monocytes Relative: 9 %
Neutro Abs: 4.8 10*3/uL (ref 1.7–7.7)
Neutrophils Relative %: 57 %
Platelets: 266 10*3/uL (ref 150–400)
RBC: 5.27 MIL/uL (ref 4.22–5.81)
RDW: 14.4 % (ref 11.5–15.5)
WBC: 8.5 10*3/uL (ref 4.0–10.5)

## 2016-06-04 LAB — BASIC METABOLIC PANEL
Anion gap: 4 — ABNORMAL LOW (ref 5–15)
BUN: 21 mg/dL — ABNORMAL HIGH (ref 6–20)
CO2: 28 mmol/L (ref 22–32)
Calcium: 9.6 mg/dL (ref 8.9–10.3)
Chloride: 108 mmol/L (ref 101–111)
Creatinine, Ser: 1.08 mg/dL (ref 0.61–1.24)
GFR calc Af Amer: >60 mL/min (ref >60)
GFR calc non Af Amer: >60 mL/min (ref >60)
Glucose, Bld: 112 mg/dL — ABNORMAL HIGH (ref 65–99)
Potassium: 4.3 mmol/L (ref 3.5–5.1)
Sodium: 140 mmol/L (ref 135–145)

## 2016-06-04 NOTE — Progress Notes (Signed)
Final ekg done 06/04/16 in epic

## 2016-06-05 ENCOUNTER — Other Ambulatory Visit: Payer: Self-pay | Admitting: Radiology

## 2016-06-05 DIAGNOSIS — L02212 Cutaneous abscess of back [any part, except buttock]: Secondary | ICD-10-CM | POA: Diagnosis not present

## 2016-06-06 ENCOUNTER — Encounter (HOSPITAL_COMMUNITY)
Admission: RE | Disposition: A | Discharge status: Home / Self Care | Payer: Self-pay | Source: Ambulatory Visit | Attending: Interventional Radiology

## 2016-06-06 ENCOUNTER — Ambulatory Visit (HOSPITAL_COMMUNITY): Payer: Medicare HMO | Admitting: Certified Registered Nurse Anesthetist

## 2016-06-06 ENCOUNTER — Encounter (HOSPITAL_COMMUNITY): Payer: Self-pay

## 2016-06-06 ENCOUNTER — Observation Stay (HOSPITAL_COMMUNITY)
Admission: RE | Admit: 2016-06-06 | Discharge: 2016-06-07 | Disposition: A | Discharge status: Home / Self Care | Payer: Medicare HMO | Source: Ambulatory Visit | Attending: Interventional Radiology | Admitting: Interventional Radiology

## 2016-06-06 ENCOUNTER — Ambulatory Visit (HOSPITAL_COMMUNITY)
Admission: RE | Admit: 2016-06-06 | Discharge: 2016-06-06 | Disposition: A | Discharge status: Home / Self Care | Payer: Medicare HMO | Source: Ambulatory Visit | Attending: Interventional Radiology | Admitting: Interventional Radiology

## 2016-06-06 DIAGNOSIS — E039 Hypothyroidism, unspecified: Secondary | ICD-10-CM | POA: Diagnosis not present

## 2016-06-06 DIAGNOSIS — Z8546 Personal history of malignant neoplasm of prostate: Secondary | ICD-10-CM | POA: Insufficient documentation

## 2016-06-06 DIAGNOSIS — N2889 Other specified disorders of kidney and ureter: Secondary | ICD-10-CM | POA: Diagnosis present

## 2016-06-06 DIAGNOSIS — D49512 Neoplasm of unspecified behavior of left kidney: Principal | ICD-10-CM | POA: Insufficient documentation

## 2016-06-06 DIAGNOSIS — Z87891 Personal history of nicotine dependence: Secondary | ICD-10-CM | POA: Insufficient documentation

## 2016-06-06 DIAGNOSIS — R310 Gross hematuria: Secondary | ICD-10-CM | POA: Diagnosis not present

## 2016-06-06 DIAGNOSIS — Z79899 Other long term (current) drug therapy: Secondary | ICD-10-CM | POA: Diagnosis not present

## 2016-06-06 DIAGNOSIS — I10 Essential (primary) hypertension: Secondary | ICD-10-CM | POA: Diagnosis not present

## 2016-06-06 DIAGNOSIS — F419 Anxiety disorder, unspecified: Secondary | ICD-10-CM | POA: Insufficient documentation

## 2016-06-06 DIAGNOSIS — J45909 Unspecified asthma, uncomplicated: Secondary | ICD-10-CM | POA: Diagnosis not present

## 2016-06-06 DIAGNOSIS — R69 Illness, unspecified: Secondary | ICD-10-CM | POA: Diagnosis not present

## 2016-06-06 HISTORY — PX: RADIOLOGY WITH ANESTHESIA: SHX6223

## 2016-06-06 LAB — TYPE AND SCREEN
ABO/RH(D): O POS
Antibody Screen: NEGATIVE

## 2016-06-06 SURGERY — RADIOLOGY WITH ANESTHESIA
Anesthesia: General | Laterality: Left

## 2016-06-06 MED ORDER — ROCURONIUM BROMIDE 50 MG/5ML IV SOSY
PREFILLED_SYRINGE | INTRAVENOUS | Status: DC | PRN
  Administered 2016-06-06: 50 mg via INTRAVENOUS
  Administered 2016-06-06 (×3): 10 mg via INTRAVENOUS

## 2016-06-06 MED ORDER — MIDAZOLAM HCL 5 MG/5ML IJ SOLN
INTRAMUSCULAR | Status: DC | PRN
  Administered 2016-06-06: 2 mg via INTRAVENOUS

## 2016-06-06 MED ORDER — ALPRAZOLAM 0.25 MG PO TABS: 0.1250 mg | ORAL_TABLET | Freq: Every evening | ORAL | Status: DC | PRN

## 2016-06-06 MED ORDER — SUCCINYLCHOLINE CHLORIDE 200 MG/10ML IV SOSY
PREFILLED_SYRINGE | INTRAVENOUS | Status: DC | PRN
  Administered 2016-06-06: 120 mg via INTRAVENOUS

## 2016-06-06 MED ORDER — ARTIFICIAL TEARS 0.1-0.3 % OP SOLN: 1.0000 [drp] | OPHTHALMIC | Status: DC | PRN

## 2016-06-06 MED ORDER — POLYVINYL ALCOHOL 1.4 % OP SOLN
1.0000 [drp] | OPHTHALMIC | Status: DC | PRN
  Filled 2016-06-06: qty 15

## 2016-06-06 MED ORDER — LIDOCAINE 2% (20 MG/ML) 5 ML SYRINGE
INTRAMUSCULAR | Status: DC | PRN
  Administered 2016-06-06: 80 mg via INTRAVENOUS

## 2016-06-06 MED ORDER — PHENYLEPHRINE HCL 10 MG/ML IJ SOLN
INTRAMUSCULAR | Status: DC | PRN
  Administered 2016-06-06 (×5): 80 ug via INTRAVENOUS

## 2016-06-06 MED ORDER — SENNOSIDES-DOCUSATE SODIUM 8.6-50 MG PO TABS: 1.0000 | ORAL_TABLET | Freq: Every day | ORAL | Status: DC | PRN

## 2016-06-06 MED ORDER — PROMETHAZINE HCL 25 MG/ML IJ SOLN: 6.2500 mg | INTRAMUSCULAR | Status: DC | PRN

## 2016-06-06 MED ORDER — IOPAMIDOL (ISOVUE-370) INJECTION 76%
100.0000 mL | Freq: Once | INTRAVENOUS | Status: AC | PRN
  Administered 2016-06-06: 50 mL via INTRAVENOUS

## 2016-06-06 MED ORDER — IOPAMIDOL (ISOVUE-370) INJECTION 76%
INTRAVENOUS | Status: AC
  Filled 2016-06-06: qty 100

## 2016-06-06 MED ORDER — EPHEDRINE SULFATE 50 MG/ML IJ SOLN
INTRAMUSCULAR | Status: DC | PRN
  Administered 2016-06-06 (×3): 5 mg via INTRAVENOUS

## 2016-06-06 MED ORDER — CEFAZOLIN SODIUM-DEXTROSE 2-4 GM/100ML-% IV SOLN
2.0000 g | INTRAVENOUS | Status: AC
  Administered 2016-06-06: 2 g via INTRAVENOUS
  Filled 2016-06-06: qty 100

## 2016-06-06 MED ORDER — ALBUTEROL SULFATE (2.5 MG/3ML) 0.083% IN NEBU: 2.5000 mg | INHALATION_SOLUTION | RESPIRATORY_TRACT | Status: DC | PRN

## 2016-06-06 MED ORDER — CARBOXYMETHYLCELL-HYPROMELLOSE 0.25-0.3 % OP GEL: 1.0000 [drp] | OPHTHALMIC | Status: DC | PRN

## 2016-06-06 MED ORDER — LACTATED RINGERS IV SOLN
INTRAVENOUS | Status: DC
  Administered 2016-06-06 (×2): via INTRAVENOUS

## 2016-06-06 MED ORDER — HYDROMORPHONE HCL 1 MG/ML IJ SOLN: 0.2500 mg | INTRAMUSCULAR | Status: DC | PRN

## 2016-06-06 MED ORDER — HYDROCODONE-ACETAMINOPHEN 5-325 MG PO TABS: 1.0000 | ORAL_TABLET | ORAL | Status: DC | PRN

## 2016-06-06 MED ORDER — ALBUTEROL SULFATE HFA 108 (90 BASE) MCG/ACT IN AERS: 2.0000 | INHALATION_SPRAY | RESPIRATORY_TRACT | Status: DC | PRN

## 2016-06-06 MED ORDER — PROPOFOL 10 MG/ML IV BOLUS
INTRAVENOUS | Status: DC | PRN
  Administered 2016-06-06: 150 mg via INTRAVENOUS

## 2016-06-06 MED ORDER — ONDANSETRON HCL 4 MG/2ML IJ SOLN
INTRAMUSCULAR | Status: DC | PRN
  Administered 2016-06-06: 4 mg via INTRAVENOUS

## 2016-06-06 MED ORDER — LEVOTHYROXINE SODIUM 25 MCG PO TABS
137.0000 ug | ORAL_TABLET | Freq: Every day | ORAL | Status: DC
  Administered 2016-06-07: 137 ug via ORAL
  Filled 2016-06-06: qty 1

## 2016-06-06 MED ORDER — SODIUM CHLORIDE 0.9 % IV SOLN
INTRAVENOUS | Status: DC
  Administered 2016-06-06: 20:00:00 via INTRAVENOUS

## 2016-06-06 MED ORDER — FENTANYL CITRATE (PF) 100 MCG/2ML IJ SOLN
INTRAMUSCULAR | Status: DC | PRN
  Administered 2016-06-06: 50 ug via INTRAVENOUS
  Administered 2016-06-06: 25 ug via INTRAVENOUS

## 2016-06-06 MED ORDER — SUGAMMADEX SODIUM 200 MG/2ML IV SOLN
INTRAVENOUS | Status: DC | PRN
  Administered 2016-06-06: 212.2 mg via INTRAVENOUS

## 2016-06-06 MED ORDER — CALCIUM CARBONATE ANTACID 500 MG PO CHEW
400.0000 mg | CHEWABLE_TABLET | Freq: Once | ORAL | Status: AC
  Administered 2016-06-06: 400 mg via ORAL
  Filled 2016-06-06: qty 2

## 2016-06-06 MED ORDER — ONDANSETRON HCL 4 MG/2ML IJ SOLN: 4.0000 mg | Freq: Four times a day (QID) | INTRAMUSCULAR | Status: DC | PRN

## 2016-06-06 MED ORDER — DOCUSATE SODIUM 100 MG PO CAPS
100.0000 mg | ORAL_CAPSULE | Freq: Two times a day (BID) | ORAL | Status: DC
  Administered 2016-06-06 – 2016-06-07 (×2): 100 mg via ORAL
  Filled 2016-06-06 (×2): qty 1

## 2016-06-06 NOTE — Anesthesia Postprocedure Evaluation (Signed)
Anesthesia Post Note  Patient: Eric Crosby  Procedure(s) Performed: Procedure(s) (LRB): left renal cryoablation (Left)  Patient location during evaluation: PACU Anesthesia Type: General Level of consciousness: awake and alert Pain management: pain level controlled Vital Signs Assessment: post-procedure vital signs reviewed and stable Respiratory status: spontaneous breathing, nonlabored ventilation, respiratory function stable and patient connected to nasal cannula oxygen Cardiovascular status: blood pressure returned to baseline and stable Postop Assessment: no signs of nausea or vomiting Anesthetic complications: no       Last Vitals:  Vitals:   06/06/16 1330 06/06/16 1347  BP:  (!) 143/92  Pulse:  90  Resp:  15  Temp: 36.7 C 36.8 C    Last Pain:  Vitals:   06/06/16 1347  TempSrc: Oral                 Samuell Knoble S

## 2016-06-06 NOTE — Procedures (Signed)
Interventional Radiology Procedure Note  Procedure:  CT guided cryoablation of left renal mass  Anesthesia:  General  Contrast: 50 mL Isovue 0000000 IV  Complications: None  Estimated Blood Loss: < 10 mL  Findings:  Left lower pole renal neoplasm treated with cryoablation via single [employer] CX probe.  Two 10 min freezes performed.  Plan: PACU followed by observation.  May be able to send home later today.  Venetia Night. Kathlene Cote, M.D Pager:  [phone]

## 2016-06-06 NOTE — Anesthesia Procedure Notes (Signed)
Procedure Name: Intubation Date/Time: 06/06/2016 9:07 AM Performed by: West Pugh Pre-anesthesia Checklist: Patient identified, Emergency Drugs available, Suction available, Patient being monitored and Timeout performed Patient Re-evaluated:Patient Re-evaluated prior to inductionOxygen Delivery Method: Circle system utilized Preoxygenation: Pre-oxygenation with 100% oxygen Intubation Type: IV induction and Cricoid Pressure applied Ventilation: Two handed mask ventilation required and Oral airway inserted - appropriate to patient size Laryngoscope Size: 3 and Glidescope Grade View: Grade I Tube type: Oral Tube size: 8.0 mm Number of attempts: 1 Airway Equipment and Method: Stylet Placement Confirmation: ETT inserted through vocal cords under direct vision,  positive ETCO2,  CO2 detector and breath sounds checked- equal and bilateral Secured at: 22 cm Tube secured with: Tape Dental Injury: Teeth and Oropharynx as per pre-operative assessment  Comments: Small abrasion to lip, lubricant applied

## 2016-06-06 NOTE — Transfer of Care (Signed)
Immediate Anesthesia Transfer of Care Note  Patient: Eric Crosby  Procedure(s) Performed: Procedure(s): left renal cryoablation (Left)  Patient Location: PACU  Anesthesia Type:General  Level of Consciousness:  sedated, patient cooperative and responds to stimulation  Airway & Oxygen Therapy:Patient Spontanous Breathing and Patient connected to face mask oxgen  Post-op Assessment:  Report given to PACU RN and Post -op Vital signs reviewed and stable  Post vital signs:  Reviewed and stable  Last Vitals:  Vitals:   06/06/16 0653  BP: 139/85  Pulse: 84  Resp: 18  Temp: 123XX123 C    Complications: No apparent anesthesia complications

## 2016-06-06 NOTE — Progress Notes (Signed)
Patient ID: Eric Crosby, male   DOB: 11-12-1945, 71 y.o.   MRN: [id number]  Patient is S/P left renal mass cryoablation performed earlier today. Currently voiding gross hematuria. Denies any pain, nausea, vomiting, or dysuria. VSS;AF. Patient will stay over night to monitor VS and urine output. Check CBC/BMP in the AM. F/u in IR clinic in 4 weeks.   [facility]

## 2016-06-06 NOTE — Progress Notes (Signed)
Attempted X 2 to place 16 Fr. Foley Catheter.  Unable to pass prostate despite lubrication and gentle pressure. No urine return noted.  After second attempt, small amount of bloody discharge noted from urethra. Dr. Kathlene Cote notified and ordered condom cath.

## 2016-06-06 NOTE — Anesthesia Preprocedure Evaluation (Signed)
Anesthesia Evaluation  Patient identified by MRN, date of birth, ID band Patient awake    Reviewed: Allergy & Precautions, NPO status , Patient's Chart, lab work & pertinent test results  History of Anesthesia Complications (+) DIFFICULT AIRWAY  Airway Mallampati: II  TM Distance: >3 FB Neck ROM: Full    Dental no notable dental hx.    Pulmonary sleep apnea , former smoker,    Pulmonary exam normal breath sounds clear to auscultation       Cardiovascular negative cardio ROS Normal cardiovascular exam Rhythm:Regular Rate:Normal     Neuro/Psych Bipolar Disorder negative neurological ROS     GI/Hepatic negative GI ROS, Neg liver ROS,   Endo/Other  negative endocrine ROS  Renal/GU negative Renal ROS  negative genitourinary   Musculoskeletal negative musculoskeletal ROS (+)   Abdominal   Peds negative pediatric ROS (+)  Hematology negative hematology ROS (+)   Anesthesia Other Findings   Reproductive/Obstetrics negative OB ROS                             Anesthesia Physical Anesthesia Plan  ASA: III  Anesthesia Plan: General   Post-op Pain Management:    Induction: Intravenous  Airway Management Planned: Oral ETT and Video Laryngoscope Planned  Additional Equipment:   Intra-op Plan:   Post-operative Plan: Extubation in OR  Informed Consent: I have reviewed the patients History and Physical, chart, labs and discussed the procedure including the risks, benefits and alternatives for the proposed anesthesia with the patient or authorized representative who has indicated his/her understanding and acceptance.   Dental advisory given  Plan Discussed with: CRNA and Surgeon  Anesthesia Plan Comments:         Anesthesia Quick Evaluation

## 2016-06-06 NOTE — Addendum Note (Signed)
Addendum  created 06/06/16 1620 by Lollie Sails, CRNA   Anesthesia Event edited

## 2016-06-06 NOTE — H&P (Signed)
Referring Physician(s): Grapey,D/Meyers,S  Supervising Physician: Aletta Edouard  Patient Status:  [facility] TBA  Chief Complaint:  Left renal mass  Subjective: Familiar to IR service from prior right renal mass cryoablation on 01/27/14. He has a remote history of prostate cancer 2011 and now presents with an enlarging left lower pole renal mass currently at 1.3 cm and consistent with a small renal carcinoma. He has been evaluated by Dr. Kathlene Cote and deemed an appropriate candidate for cryoablation of this left renal mass. Current imaging shows further retraction of ablation defect of the posterior contralateral right kidney with no evidence of recurrent or enhancing tumor. There is no evidence of metastatic disease in the abdomen.He presents today for the above procedure. He currently denies fever, headache, chest pain, dyspnea, cough, abdominal pain, back pain, nausea, vomiting, hematuria/dysuria . Past Medical History:  Diagnosis Date  . Anxiety    hx of  . Arthritis    hx of  . Asthma    seasonal chronic  . Bipolar disorder ([place])    hx of  . Blood dyscrasia    POLYCYTHEMIA- NO MEDICATION OR TREATMENT NEEDED - PT TOLD TO MAKE EVERYONE AWARE HIS RBC CT HIGH - BUT NORMAL FOR HIM  . Complication of anesthesia   . Difficult intubation    Mask ventilation with an oral airway, grade III view with a MAC 4 and unable to pass bougie, grade II view with a glidescope. Recommend using glidescope for future intubations  . Diverticulosis    COLITIS  . History of kidney stones   . Hx of wheezing    OCCAS WHEEZING - POSS RELATED TO ALLERGIES OR ENVIRONMENTAL ISSUES-HAVE INHALER BUT HAS RARELY NEEDED  . Hypothyroidism   . 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 Prostate cancer ([place]) 2011   TX'D WITH SEED IMPLANT  . Right renal mass   . Sleep apnea    PT USED CPAP FOR SEVERAL YEARS BUT IT MADE  HIS EYE PROBLEM WORSE AND HE CAN NOT USE THE MACHINE NOW.   Past Surgical History:  Procedure Laterality Date  . colonoscopy    . EYE SURGERY     x 20  . eyelid surgery     17 SURGICAL PROCEDURES BLATERAL UPPER EYE LIDS  . HERNIA REPAIR     UMBILICAL  AND LOWER ABDOMINAL HERNIA REPAIR  . IR GENERIC HISTORICAL  03/25/2016   IR RADIOLOGIST EVAL & MGMT 03/25/2016 Aletta Edouard, MD [facility] INTERV RAD  . IR GENERIC HISTORICAL  07/11/2014   IR RADIOLOGIST EVAL & MGMT 07/11/2014 Aletta Edouard, MD [facility] INTERV RAD  . NECK SURGERY     CERVICAL FUSION - PT STATES SLIGHT LIMITED ROM  . RIGHT CATARACT EXTRACTION WITH LENS IMPLANT    . RIGHT RETINAL SURGERY FOR BUCKLE    . SEED IMPLANT FOR PROSTATE CANCER    . WISDOM TOOTH EXTRACTION        Allergies: Sulfa antibiotics  Medications: Prior to Admission medications   Medication Sig Start Date End Date Taking? Authorizing Provider  albuterol (PROVENTIL HFA;VENTOLIN HFA) 108 (90 BASE) MCG/ACT inhaler Inhale 2 puffs into the lungs every 4 (four) hours as needed for wheezing or shortness of breath.    Yes Historical Provider, MD  ALPRAZolam (XANAX) 0.25 MG tablet Take 0.125 mg by mouth at bedtime as needed for anxiety.    Yes Historical Provider, MD  ARTIFICIAL TEARS 0.1-0.3 % SOLN Place 1 drop into both eyes as needed for dry eyes.   Yes Historical Provider, MD  calcium carbonate (TUMS - DOSED IN MG ELEMENTAL CALCIUM) 500 MG chewable tablet Chew 2 tablets by mouth 2 (two) times daily as needed for indigestion or heartburn.   Yes Historical Provider, MD  Carboxymethylcell-Hypromellose (GENTEAL OP) Apply 1 application to eye as needed (dry eyes).   Yes Historical Provider, MD  Ibuprofen (ADVIL) 200 MG CAPS Take 400-600 mg by mouth See admin instructions. Pt takes three tablets in the morning (600 mg) and two tablets at night (480m)   Yes Historical Provider, MD  levothyroxine (SYNTHROID, LEVOTHROID) 137 MCG tablet Take 137 mcg by mouth daily before  breakfast.   Yes Historical Provider, MD  Multiple Vitamins-Minerals (ALIVE MENS ENERGY PO) Take 1 tablet by mouth daily.   Yes Historical Provider, MD  Probiotic Product ([place] Take 1 tablet by mouth daily.   Yes Historical Provider, MD  [facility] 300 MG CAPS Take 300 mg by mouth daily.   Yes Historical Provider, MD  meclizine (ANTIVERT) 25 MG tablet Take 25 mg by mouth daily as needed for dizziness.     Historical Provider, MD     Vital Signs: BP 139/85   Pulse 84   Temp 97.9 F (36.6 C) (Oral)   Resp 18   SpO2 96%   Physical Exam patient awake, alert. Chest clear to auscultation bilaterally. Heart with regular rate and rhythm. Abdomen soft, positive bowel sounds, nontender. Lower extremities with no edema. Bandage noted over previously draining pimple  right scapular region, site NT  Imaging: Dg Chest 1 View  Result Date: 06/04/2016 CLINICAL DATA:  Preop for renal tumor ablation. EXAM: CHEST 1 VIEW COMPARISON:  Two-view chest x-ray 01/20/2014 FINDINGS: The heart size is normal. Mild interstitial coarsening is stable. There is no edema or effusion. No focal airspace disease is present. There is no nodule mass lesion. Surgical changes are again seen at the base of the cervical spine. The visualized soft tissues and bony thorax are otherwise unremarkable. IMPRESSION: No acute cardiopulmonary disease or significant interval change. Electronically Signed   By: CSan MorelleM.D.   On: 06/04/2016 14:53    Labs:  CBC:  Recent Labs  06/04/16 1330  WBC 8.5  HGB 16.5  HCT 48.1  PLT 266    COAGS:  Recent Labs  06/04/16 1330  INR 1.02    BMP:  Recent Labs  03/25/16 0858 06/04/16 1330  NA  --  140  K  --  4.3  CL  --  108  CO2  --  28  GLUCOSE  --  112*  BUN  --  21*  CALCIUM  --  9.6  CREATININE 0.90 1.08  GFRNONAA  --  >60  GFRAA  --  >60    LIVER FUNCTION TESTS: No results for input(s): BILITOT, AST, ALT, ALKPHOS, PROT, ALBUMIN  in the last 8760 hours.  Assessment and Plan: Pt with remote history of prostate cancer 2011, prior right renal mass cryoablation in 2015 ; now presents with an enlarging left lower pole renal mass currently at 1.3 cm and consistent with a small renal carcinoma. He has been evaluated by Dr. YKathlene Coteand deemed an appropriate candidate for CT guided cryoablation of this left renal mass. He presents today for the procedure. Details/risks of procedure, including but not limited to, internal bleeding, infection, injury to adjacent structures, worsening renal function, anesthesia-related  complications discussed with patient with his understanding and consent. Postprocedure he will be observed in the hospital and potentially admitted for overnight observation.   Electronically Signed: D. Rowe Robert 06/06/2016, 8:30 AM   I spent a total of 30 minutes at the the patient's bedside AND on the patient's hospital floor or unit, greater than 50% of which was counseling/coordinating care for CT-guided left renal mass cryoablation

## 2016-06-06 NOTE — Progress Notes (Addendum)
Pt arrived today for his procedure in Radiology.States since his PST visit he noted a "pimple,clogged pore" right scapula area that "was draining a little". Went to PCP on 06/05/16 and NP there "drained it some more and put some antibacterial ointment on it". Pt had a 2 inch bandage in right scapular area.

## 2016-06-07 DIAGNOSIS — N2889 Other specified disorders of kidney and ureter: Secondary | ICD-10-CM | POA: Diagnosis not present

## 2016-06-07 DIAGNOSIS — D49512 Neoplasm of unspecified behavior of left kidney: Secondary | ICD-10-CM | POA: Diagnosis not present

## 2016-06-07 LAB — BASIC METABOLIC PANEL
Anion gap: 4 — ABNORMAL LOW (ref 5–15)
BUN: 17 mg/dL (ref 6–20)
CO2: 27 mmol/L (ref 22–32)
Calcium: 8.9 mg/dL (ref 8.9–10.3)
Chloride: 109 mmol/L (ref 101–111)
Creatinine, Ser: 1.01 mg/dL (ref 0.61–1.24)
GFR calc Af Amer: >60 mL/min (ref >60)
GFR calc non Af Amer: >60 mL/min (ref >60)
Glucose, Bld: 114 mg/dL — ABNORMAL HIGH (ref 65–99)
Potassium: 3.7 mmol/L (ref 3.5–5.1)
Sodium: 140 mmol/L (ref 135–145)

## 2016-06-07 LAB — CBC
HCT: 42.3 % (ref 39.0–52.0)
Hemoglobin: 14.2 g/dL (ref 13.0–17.0)
MCH: 31 pg (ref 26.0–34.0)
MCHC: 33.6 g/dL (ref 30.0–36.0)
MCV: 92.4 fL (ref 78.0–100.0)
Platelets: 235 10*3/uL (ref 150–400)
RBC: 4.58 MIL/uL (ref 4.22–5.81)
RDW: 14.7 % (ref 11.5–15.5)
WBC: 11.2 10*3/uL — ABNORMAL HIGH (ref 4.0–10.5)

## 2016-06-07 NOTE — Progress Notes (Signed)
Patient's d/c instructions given, verbalized understanding. Patient d/c home. Stable.

## 2016-06-07 NOTE — Discharge Summary (Signed)
   Patient ID: Eric Crosby MRN: [id number] DOB/AGE: 08/01/1945 71 y.o.  Admit date: 06/06/2016 Discharge date: 06/07/2016  Supervising Physician: Markus Daft  Admission Diagnoses: left renal mass  Discharge Diagnoses:  Active Problems:   Left renal mass   Discharged Condition: good  Hospital Course: The patient was admitted and underwent a left renal cryoablation.  He did have multiple foley attempts with some hematuria secondary to trauma from multiple attempts.  This resolved by POD1.  He tolerated his procedure well.  He has no pain.  He is tolerating a regular diet.  He has mobilized.  He is stable for DC home.  Consults: None  Discharge Exam: Blood pressure 115/67, pulse 100, temperature 99.1 F (37.3 C), temperature source Oral, resp. rate 18, height 6\' 2"  (1.88 m), weight 233 lb 11 oz (106 kg), SpO2 93 %. General appearance: alert, cooperative and no distress Resp: clear to auscultation bilaterally Cardio: regular rate and rhythm Skin: left renal puncture site is c/d/i with no ecchymosis or evidence of infection  Disposition: 01-Home or Self Care   Allergies as of 06/07/2016      Reactions   Sulfa Antibiotics Other (See Comments)   Childhood reaction  Unknown      Medication List    TAKE these medications   ADVIL 200 MG Caps Generic drug:  Ibuprofen Take 400-600 mg by mouth See admin instructions. Pt takes three tablets in the morning (600 mg) and two tablets at night (400mg )   albuterol 108 (90 Base) MCG/ACT inhaler Commonly known as:  PROVENTIL HFA;VENTOLIN HFA Inhale 2 puffs into the lungs every 4 (four) hours as needed for wheezing or shortness of breath.   ALIVE MENS ENERGY PO Take 1 tablet by mouth daily.   ALPRAZolam 0.25 MG tablet Commonly known as:  XANAX Take 0.125 mg by mouth at bedtime as needed for anxiety.   ARTIFICIAL TEARS 0.1-0.3 % Soln Place 1 drop into both eyes as needed for dry eyes.   calcium carbonate 500 MG chewable  tablet Commonly known as:  TUMS - dosed in mg elemental calcium Chew 2 tablets by mouth 2 (two) times daily as needed for indigestion or heartburn.   GENTEAL OP Apply 1 application to eye as needed (dry eyes).   levothyroxine 137 MCG tablet Commonly known as:  SYNTHROID, LEVOTHROID Take 137 mcg by mouth daily before breakfast.   meclizine 25 MG tablet Commonly known as:  ANTIVERT Take 25 mg by mouth daily as needed for dizziness.   PHILLIPS COLON HEALTH PO Take 1 tablet by mouth daily.   [place] Wort 300 MG Caps Take 300 mg by mouth daily.      Follow-up Information    YAMAGATA,GLENN T, MD Follow up in 1 month(s).   Specialty:  Interventional Radiology Why:  our office will call you with appointment time and date Contact information: [address] [place] [address] [id number]            Electronically Signed: Henreitta Cea 06/07/2016, 9:09 AM   I have spent Less Than 30 Minutes discharging Eric Crosby.

## 2016-06-07 NOTE — Discharge Instructions (Signed)
Cryoablation, Care After °This sheet gives you information about how to care for yourself after your procedure. Your health care provider may also give you more specific instructions. If you have problems or questions, contact your health care provider. °What can I expect after the procedure? °After the procedure, it is common to have: °· Soreness around the treatment area. °· Mild pain and swelling in the treatment area. °Follow these instructions at home: °Treatment area care  ° °· Follow instructions from your health care provider about how to take care of your incision. Make sure you: °¨ Wash your hands with soap and water before you change your bandage (dressing). If soap and water are not available, use hand sanitizer. °¨ Change your dressing as told by your health care provider. °¨ Leave stitches (sutures) in place. They may need to stay in place for 2 weeks or longer. °· Check your treatment area every day for signs of infection. Check for: °¨ More redness, swelling, or pain. °¨ More fluid or blood. °¨ Warmth. °¨ Pus or a bad smell. °· Keep the treated area clean, dry, and covered with a dressing until it has healed. Clean the area with soap and water or as told by your health care provider. °· You may shower if your health care provider approves. If your bandage gets wet, change it right away. °Activity  °· Follow instructions from your health care provider about any activity limitations. °· Do not drive for 24 hours if you received a medicine to help you relax (sedative). °General instructions  °· Take over-the-counter and prescription medicines only as told by your health care provider. °· Keep all follow-up visits as told by your health care provider. This is important. °Contact a health care provider if: °· You do not have a bowel movement for 2 days. °· You have nausea or vomiting. °· You have more redness, swelling, or pain around your treatment area. °· You have more fluid or blood coming from your  treatment area. °· Your treatment area feels warm to the touch. °· You have pus or a bad smell coming from your treatment area. °· You have a fever. °Get help right away if: °· You have severe pain. °· You have trouble swallowing or breathing. °· You have severe weakness or dizziness. °· You have chest pain or shortness of breath. °This information is not intended to replace advice given to you by your health care provider. Make sure you discuss any questions you have with your health care provider. °Document Released: 02/16/2013 Document Revised: 11/16/2015 Document Reviewed: 09/26/2015 °Elsevier Interactive Patient Education © 2017 [employer] Inc. ° °

## 2016-06-11 DIAGNOSIS — Z961 Presence of intraocular lens: Secondary | ICD-10-CM | POA: Diagnosis not present

## 2016-06-11 DIAGNOSIS — H02054 Trichiasis without entropian left upper eyelid: Secondary | ICD-10-CM | POA: Diagnosis not present

## 2016-06-11 DIAGNOSIS — H02051 Trichiasis without entropian right upper eyelid: Secondary | ICD-10-CM | POA: Diagnosis not present

## 2016-06-11 DIAGNOSIS — L121 Cicatricial pemphigoid: Secondary | ICD-10-CM | POA: Diagnosis not present

## 2016-06-23 DIAGNOSIS — L02212 Cutaneous abscess of back [any part, except buttock]: Secondary | ICD-10-CM | POA: Diagnosis not present

## 2016-07-09 ENCOUNTER — Ambulatory Visit
Admission: RE | Admit: 2016-07-09 | Discharge: 2016-07-09 | Disposition: A | Discharge status: Home / Self Care | Payer: Medicare HMO | Source: Ambulatory Visit | Attending: General Surgery | Admitting: General Surgery

## 2016-07-09 DIAGNOSIS — N2889 Other specified disorders of kidney and ureter: Secondary | ICD-10-CM | POA: Diagnosis not present

## 2016-07-09 HISTORY — PX: IR GENERIC HISTORICAL: IMG1180011

## 2016-07-09 NOTE — Progress Notes (Signed)
Referring Physician(s): Dr Ellwood Sayers  Chief Complaint: The patient is seen in follow up today s/p  Left renal mass cryoablation 06/06/2016  History of present illness:  Hx prostate Ca Hx Right renal mass cryoablation 01/2014 Has done well after that procedure New Left renal lesion identified and ablated 06/06/2016 No complaints at this point Denies pain; denies fever Urinating well; clear yellow  Did experience some bleeding before and after urination for approx 3-4 days after procedure. (inability to catheterize pt for procedure secondary probable urethral narrowing---2 attempts only) Did experience some skin irritation at lips and face site of taping around intubation tube---resolved 3-4 days.  No imaging today  Past Medical History:  Diagnosis Date  . Anxiety    hx of  . Arthritis    hx of  . Asthma    seasonal chronic  . Bipolar disorder ([place])    hx of  . Blood dyscrasia    POLYCYTHEMIA- NO MEDICATION OR TREATMENT NEEDED - PT TOLD TO MAKE EVERYONE AWARE HIS RBC CT HIGH - BUT NORMAL FOR HIM  . Complication of anesthesia   . Difficult intubation    Mask ventilation with an oral airway, grade III view with a MAC 4 and unable to pass bougie, grade II view with a glidescope. Recommend using glidescope for future intubations  . Diverticulosis    COLITIS  . History of kidney stones   . Hx of wheezing    OCCAS WHEEZING - POSS RELATED TO ALLERGIES OR ENVIRONMENTAL ISSUES-HAVE INHALER BUT HAS RARELY NEEDED  . Hypothyroidism   . 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 Prostate cancer ([place]) 2011   TX'D WITH SEED IMPLANT  . Right renal mass   . Sleep apnea    PT USED CPAP FOR SEVERAL YEARS BUT IT MADE HIS EYE PROBLEM WORSE AND HE CAN NOT USE THE MACHINE NOW.    Past Surgical History:  Procedure Laterality Date  . colonoscopy    . EYE SURGERY     x 20  . eyelid surgery     17 SURGICAL PROCEDURES BLATERAL UPPER EYE LIDS  . HERNIA REPAIR     UMBILICAL  AND LOWER ABDOMINAL HERNIA REPAIR  . IR GENERIC HISTORICAL  03/25/2016   IR RADIOLOGIST EVAL & MGMT 03/25/2016 Aletta Edouard, MD [facility] INTERV RAD  . IR GENERIC HISTORICAL  07/11/2014   IR RADIOLOGIST EVAL & MGMT 07/11/2014 Aletta Edouard, MD [facility] INTERV RAD  . NECK SURGERY     CERVICAL FUSION - PT STATES SLIGHT LIMITED ROM  . RIGHT CATARACT EXTRACTION WITH LENS IMPLANT    . RIGHT RETINAL SURGERY FOR BUCKLE    . SEED IMPLANT FOR PROSTATE CANCER    . WISDOM TOOTH EXTRACTION      Allergies: Adhesive [tape] and Sulfa antibiotics  Medications: Prior to Admission medications   Medication Sig Start Date End Date Taking? Authorizing Provider  albuterol (PROVENTIL HFA;VENTOLIN HFA) 108 (90 BASE) MCG/ACT inhaler Inhale 2 puffs into the lungs every 4 (four) hours as needed for wheezing or shortness of breath.    Yes Historical Provider, MD  ALPRAZolam (XANAX) 0.25 MG tablet Take 0.125 mg by mouth at bedtime as needed for anxiety.    Yes Historical Provider, MD  ARTIFICIAL TEARS 0.1-0.3 % SOLN Place 1 drop into both eyes as needed for dry eyes.   Yes Historical Provider, MD  calcium carbonate (TUMS -  DOSED IN MG ELEMENTAL CALCIUM) 500 MG chewable tablet Chew 2 tablets by mouth 2 (two) times daily as needed for indigestion or heartburn.   Yes Historical Provider, MD  Carboxymethylcell-Hypromellose (GENTEAL OP) Apply 1 application to eye as needed (dry eyes).   Yes Historical Provider, MD  Ibuprofen (ADVIL) 200 MG CAPS Take 400-600 mg by mouth See admin instructions. Pt takes three tablets in the morning (600 mg) and two tablets at night (431m)   Yes Historical Provider, MD  levothyroxine (SYNTHROID, LEVOTHROID) 137 MCG tablet Take 137 mcg by mouth daily before breakfast.   Yes Historical Provider, MD  meclizine (ANTIVERT) 25 MG tablet Take 25 mg by mouth daily as needed for dizziness.    Yes Historical Provider, MD    Multiple Vitamins-Minerals (ALIVE MENS ENERGY PO) Take 1 tablet by mouth daily.   Yes Historical Provider, MD  [facility]ort 300 MG CAPS Take 300 mg by mouth daily.   Yes Historical Provider, MD  Probiotic Product (POlivet Take 1 tablet by mouth daily.    Historical Provider, MD     No family history on file.  Social History   Social History  . Marital status: Married    Spouse name: N/A  . Number of children: N/A  . Years of education: N/A   Social History Main Topics  . Smoking status: Former SResearch scientist (life sciences) . Smokeless tobacco: Never Used     Comment: 26 years ago  . Alcohol use No  . Drug use: No  . Sexual activity: Yes   Other Topics Concern  . Not on file   Social History Narrative  . No narrative on file     Vital Signs: BP 119/77 (BP Location: Left Arm, Patient Position: Sitting, Cuff Size: Large)   Pulse 84   Temp 98.2 F (36.8 C) (Oral)   Resp 15   Ht 6' 2"  (1.88 m)   Wt 235 lb (106.6 kg)   SpO2 95%   BMI 30.17 kg/m   Physical Exam  Constitutional: He is oriented to person, place, and time.  Cardiovascular: Normal rate, regular rhythm and normal heart sounds.   Pulmonary/Chest: Effort normal and breath sounds normal. He has no wheezes.  Abdominal: Soft. Bowel sounds are normal. There is no tenderness.  Musculoskeletal: Normal range of motion.  Neurological: He is alert and oriented to person, place, and time.  Skin: Skin is warm and dry.  Psychiatric: He has a normal mood and affect. His behavior is normal.  Nursing note and vitals reviewed.   Imaging: No results found.  Labs:  CBC:  Recent Labs  06/04/16 1330 06/07/16 0414  WBC 8.5 11.2*  HGB 16.5 14.2  HCT 48.1 42.3  PLT 266 235    COAGS:  Recent Labs  06/04/16 1330  INR 1.02    BMP:  Recent Labs  03/25/16 0858 06/04/16 1330 06/07/16 0414  NA  --  140 140  K  --  4.3 3.7  CL  --  108 109  CO2  --  28 27  GLUCOSE  --  112* 114*  BUN  --  21* 17   CALCIUM  --  9.6 8.9  CREATININE 0.90 1.08 1.01  GFRNONAA  --  >60 >60  GFRAA  --  >60 >60    LIVER FUNCTION TESTS: No results for input(s): BILITOT, AST, ALT, ALKPHOS, PROT, ALBUMIN in the last 8760 hours.  Assessment:  Left renal mass cryoablation 06/06/16 Doing well Dr YKathlene Cote  has evaluated pt  He has reviewed imaging of procedure with pt All questions answered Plan for CT 2 months (April 2018) Pt is agreeable to plan   Signed: Ivor Kishi A 07/09/2016, 10:42 AM   Please refer to Dr. Kathlene Cote attestation of this note for management and plan.

## 2016-07-18 ENCOUNTER — Encounter: Payer: Self-pay | Admitting: Interventional Radiology

## 2016-07-29 DIAGNOSIS — K219 Gastro-esophageal reflux disease without esophagitis: Secondary | ICD-10-CM | POA: Diagnosis not present

## 2016-07-29 DIAGNOSIS — Z13220 Encounter for screening for lipoid disorders: Secondary | ICD-10-CM | POA: Diagnosis not present

## 2016-07-29 DIAGNOSIS — Z131 Encounter for screening for diabetes mellitus: Secondary | ICD-10-CM | POA: Diagnosis not present

## 2016-07-29 DIAGNOSIS — R69 Illness, unspecified: Secondary | ICD-10-CM | POA: Diagnosis not present

## 2016-07-29 DIAGNOSIS — I700 Atherosclerosis of aorta: Secondary | ICD-10-CM | POA: Diagnosis not present

## 2016-07-29 DIAGNOSIS — Z0000 Encounter for general adult medical examination without abnormal findings: Secondary | ICD-10-CM | POA: Diagnosis not present

## 2016-07-29 DIAGNOSIS — Z87891 Personal history of nicotine dependence: Secondary | ICD-10-CM | POA: Diagnosis not present

## 2016-07-29 DIAGNOSIS — J45909 Unspecified asthma, uncomplicated: Secondary | ICD-10-CM | POA: Diagnosis not present

## 2016-07-29 DIAGNOSIS — E039 Hypothyroidism, unspecified: Secondary | ICD-10-CM | POA: Diagnosis not present

## 2016-08-01 ENCOUNTER — Other Ambulatory Visit: Payer: Self-pay | Admitting: Family Medicine

## 2016-08-01 DIAGNOSIS — Z87891 Personal history of nicotine dependence: Secondary | ICD-10-CM

## 2016-08-05 ENCOUNTER — Encounter (HOSPITAL_COMMUNITY): Payer: Self-pay | Admitting: Interventional Radiology

## 2016-08-12 ENCOUNTER — Ambulatory Visit
Admission: RE | Admit: 2016-08-12 | Discharge: 2016-08-12 | Disposition: A | Discharge status: Home / Self Care | Payer: Medicare HMO | Source: Ambulatory Visit | Attending: Family Medicine | Admitting: Family Medicine

## 2016-08-12 DIAGNOSIS — Z87891 Personal history of nicotine dependence: Secondary | ICD-10-CM | POA: Diagnosis not present

## 2016-08-12 DIAGNOSIS — Z136 Encounter for screening for cardiovascular disorders: Secondary | ICD-10-CM | POA: Diagnosis not present

## 2016-08-13 DIAGNOSIS — R69 Illness, unspecified: Secondary | ICD-10-CM | POA: Diagnosis not present

## 2016-08-19 DIAGNOSIS — R062 Wheezing: Secondary | ICD-10-CM | POA: Diagnosis not present

## 2016-08-19 DIAGNOSIS — R0600 Dyspnea, unspecified: Secondary | ICD-10-CM | POA: Diagnosis not present

## 2016-08-19 DIAGNOSIS — R0981 Nasal congestion: Secondary | ICD-10-CM | POA: Diagnosis not present

## 2016-08-19 DIAGNOSIS — J22 Unspecified acute lower respiratory infection: Secondary | ICD-10-CM | POA: Diagnosis not present

## 2016-08-22 DIAGNOSIS — H029 Unspecified disorder of eyelid: Secondary | ICD-10-CM | POA: Diagnosis not present

## 2016-08-22 DIAGNOSIS — Z973 Presence of spectacles and contact lenses: Secondary | ICD-10-CM | POA: Diagnosis not present

## 2016-08-22 DIAGNOSIS — Z87891 Personal history of nicotine dependence: Secondary | ICD-10-CM | POA: Diagnosis not present

## 2016-08-22 DIAGNOSIS — K219 Gastro-esophageal reflux disease without esophagitis: Secondary | ICD-10-CM | POA: Diagnosis not present

## 2016-08-22 DIAGNOSIS — H8110 Benign paroxysmal vertigo, unspecified ear: Secondary | ICD-10-CM | POA: Diagnosis not present

## 2016-08-22 DIAGNOSIS — Z79899 Other long term (current) drug therapy: Secondary | ICD-10-CM | POA: Diagnosis not present

## 2016-08-22 DIAGNOSIS — Z7952 Long term (current) use of systemic steroids: Secondary | ICD-10-CM | POA: Diagnosis not present

## 2016-08-22 DIAGNOSIS — Z792 Long term (current) use of antibiotics: Secondary | ICD-10-CM | POA: Diagnosis not present

## 2016-08-22 DIAGNOSIS — Z0000 Encounter for general adult medical examination without abnormal findings: Secondary | ICD-10-CM | POA: Diagnosis not present

## 2016-08-22 DIAGNOSIS — R251 Tremor, unspecified: Secondary | ICD-10-CM | POA: Diagnosis not present

## 2016-08-22 DIAGNOSIS — G3184 Mild cognitive impairment, so stated: Secondary | ICD-10-CM | POA: Diagnosis not present

## 2016-08-22 DIAGNOSIS — R0989 Other specified symptoms and signs involving the circulatory and respiratory systems: Secondary | ICD-10-CM | POA: Diagnosis not present

## 2016-08-22 DIAGNOSIS — R69 Illness, unspecified: Secondary | ICD-10-CM | POA: Diagnosis not present

## 2016-08-22 DIAGNOSIS — E039 Hypothyroidism, unspecified: Secondary | ICD-10-CM | POA: Diagnosis not present

## 2016-08-22 DIAGNOSIS — Z79890 Hormone replacement therapy (postmenopausal): Secondary | ICD-10-CM | POA: Diagnosis not present

## 2016-08-22 DIAGNOSIS — H9319 Tinnitus, unspecified ear: Secondary | ICD-10-CM | POA: Diagnosis not present

## 2016-08-27 ENCOUNTER — Other Ambulatory Visit (HOSPITAL_COMMUNITY): Payer: Self-pay | Admitting: Interventional Radiology

## 2016-08-27 ENCOUNTER — Other Ambulatory Visit: Payer: Self-pay | Admitting: Radiology

## 2016-08-27 DIAGNOSIS — N2889 Other specified disorders of kidney and ureter: Secondary | ICD-10-CM

## 2016-08-27 DIAGNOSIS — H2512 Age-related nuclear cataract, left eye: Secondary | ICD-10-CM | POA: Diagnosis not present

## 2016-08-27 DIAGNOSIS — Z961 Presence of intraocular lens: Secondary | ICD-10-CM | POA: Diagnosis not present

## 2016-08-27 DIAGNOSIS — H02051 Trichiasis without entropian right upper eyelid: Secondary | ICD-10-CM | POA: Diagnosis not present

## 2016-08-27 DIAGNOSIS — H02054 Trichiasis without entropian left upper eyelid: Secondary | ICD-10-CM | POA: Diagnosis not present

## 2016-08-27 DIAGNOSIS — L121 Cicatricial pemphigoid: Secondary | ICD-10-CM | POA: Diagnosis not present

## 2016-08-29 DIAGNOSIS — N2889 Other specified disorders of kidney and ureter: Secondary | ICD-10-CM | POA: Diagnosis not present

## 2016-08-29 LAB — BUN/CREATININE RATIO
BUN/Creatinine Ratio: 18.3 Ratio (ref 6–22)
BUN: 19 mg/dL (ref 7–25)
Creat: 1.04 mg/dL (ref 0.70–1.18)
GFR, Est African American: 84 mL/min (ref >=60)
GFR, Est Non African American: 72 mL/min (ref >=60)

## 2016-08-29 LAB — CREATININE WITH EST GFR
Creat: 1.04 mg/dL (ref 0.70–1.18)
GFR, Est African American: 84 mL/min (ref >=60)
GFR, Est Non African American: 72 mL/min (ref >=60)

## 2016-09-09 ENCOUNTER — Ambulatory Visit (HOSPITAL_COMMUNITY)
Admission: RE | Admit: 2016-09-09 | Discharge: 2016-09-09 | Disposition: A | Discharge status: Home / Self Care | Payer: Medicare HMO | Source: Ambulatory Visit | Attending: Interventional Radiology | Admitting: Interventional Radiology

## 2016-09-09 ENCOUNTER — Encounter (HOSPITAL_COMMUNITY): Payer: Self-pay

## 2016-09-09 ENCOUNTER — Other Ambulatory Visit: Payer: Medicare HMO

## 2016-09-09 ENCOUNTER — Ambulatory Visit
Admission: RE | Admit: 2016-09-09 | Discharge: 2016-09-09 | Disposition: A | Discharge status: Home / Self Care | Payer: Medicare HMO | Source: Ambulatory Visit | Attending: Interventional Radiology | Admitting: Interventional Radiology

## 2016-09-09 DIAGNOSIS — K449 Diaphragmatic hernia without obstruction or gangrene: Secondary | ICD-10-CM | POA: Insufficient documentation

## 2016-09-09 DIAGNOSIS — N2889 Other specified disorders of kidney and ureter: Secondary | ICD-10-CM

## 2016-09-09 DIAGNOSIS — M5136 Other intervertebral disc degeneration, lumbar region: Secondary | ICD-10-CM | POA: Insufficient documentation

## 2016-09-09 DIAGNOSIS — I700 Atherosclerosis of aorta: Secondary | ICD-10-CM | POA: Insufficient documentation

## 2016-09-09 DIAGNOSIS — N200 Calculus of kidney: Secondary | ICD-10-CM | POA: Diagnosis not present

## 2016-09-09 HISTORY — PX: IR RADIOLOGIST EVAL & MGMT: IMG5224

## 2016-09-09 MED ORDER — IOPAMIDOL (ISOVUE-300) INJECTION 61%
100.0000 mL | Freq: Once | INTRAVENOUS | Status: AC | PRN
  Administered 2016-09-09: 100 mL via INTRAVENOUS

## 2016-09-09 MED ORDER — IOPAMIDOL (ISOVUE-300) INJECTION 61%
INTRAVENOUS | Status: AC
  Filled 2016-09-09: qty 100

## 2016-09-09 NOTE — Progress Notes (Signed)
Chief Complaint: Status post cryoablation of a left renal neoplasm on 06/06/2016 and previous cryoablation of a right renal neoplasm on 01/27/2014.  History of Present Illness: Eric Crosby is a 71 y.o. male who has been doing well after left renal mass cryoablation on 06/06/2016. He denies any hematuria, pain, numbness or fever.  Past Medical History:  Diagnosis Date  . Anxiety    hx of  . Arthritis    hx of  . Asthma    seasonal chronic  . Bipolar disorder ([place])    hx of  . Blood dyscrasia    POLYCYTHEMIA- NO MEDICATION OR TREATMENT NEEDED - PT TOLD TO MAKE EVERYONE AWARE HIS RBC CT HIGH - BUT NORMAL FOR HIM  . Complication of anesthesia   . Difficult intubation    Mask ventilation with an oral airway, grade III view with a MAC 4 and unable to pass bougie, grade II view with a glidescope. Recommend using glidescope for future intubations  . Diverticulosis    COLITIS  . History of kidney stones   . Hx of wheezing    OCCAS WHEEZING - POSS RELATED TO ALLERGIES OR ENVIRONMENTAL ISSUES-HAVE INHALER BUT HAS RARELY NEEDED  . Hypothyroidism   . 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 Prostate cancer ([place]) 2011   TX'D WITH SEED IMPLANT  . Right renal mass   . Sleep apnea    PT USED CPAP FOR SEVERAL YEARS BUT IT MADE HIS EYE PROBLEM WORSE AND HE CAN NOT USE THE MACHINE NOW.    Past Surgical History:  Procedure Laterality Date  . colonoscopy    . EYE SURGERY     x 20  . eyelid surgery     17 SURGICAL PROCEDURES BLATERAL UPPER EYE LIDS  . HERNIA REPAIR     UMBILICAL  AND LOWER ABDOMINAL HERNIA REPAIR  . IR GENERIC HISTORICAL  03/25/2016   IR RADIOLOGIST EVAL & MGMT 03/25/2016 Aletta Edouard, MD [facility]  . IR GENERIC HISTORICAL  07/11/2014   IR RADIOLOGIST EVAL & MGMT 07/11/2014 Aletta Edouard, MD [facility]  . IR GENERIC HISTORICAL  07/09/2016   IR RADIOLOGIST  EVAL & MGMT 07/09/2016 Aletta Edouard, MD [facility]  . NECK SURGERY     CERVICAL FUSION - PT STATES SLIGHT LIMITED ROM  . RADIOLOGY WITH ANESTHESIA Left 06/06/2016   Procedure: left renal cryoablation;  Surgeon: Aletta Edouard, MD;  Location: [facility];  Service: Radiology;  Laterality: Left;  . RIGHT CATARACT EXTRACTION WITH LENS IMPLANT    . RIGHT RETINAL SURGERY FOR BUCKLE    . SEED IMPLANT FOR PROSTATE CANCER    . WISDOM TOOTH EXTRACTION      Allergies: Adhesive [tape] and Sulfa antibiotics  Medications: Prior to Admission medications   Medication Sig Start Date End Date Taking? Authorizing Provider  albuterol (PROVENTIL HFA;VENTOLIN HFA) 108 (90 BASE) MCG/ACT inhaler Inhale 2 puffs into the lungs every 4 (four) hours as needed for wheezing or shortness of breath.     Historical Provider, MD  ALPRAZolam Duanne Moron) 0.25 MG tablet Take 0.125 mg by mouth at bedtime as needed for anxiety.     Historical Provider, MD  ARTIFICIAL TEARS 0.1-0.3 % SOLN Place 1 drop into both eyes as needed for dry eyes.    Historical Provider, MD  calcium carbonate (TUMS - DOSED IN MG ELEMENTAL CALCIUM) 500  MG chewable tablet Chew 2 tablets by mouth 2 (two) times daily as needed for indigestion or heartburn.    Historical Provider, MD  Carboxymethylcell-Hypromellose (GENTEAL OP) Apply 1 application to eye as needed (dry eyes).    Historical Provider, MD  Ibuprofen (ADVIL) 200 MG CAPS Take 400-600 mg by mouth See admin instructions. Pt takes three tablets in the morning (600 mg) and two tablets at night (465m)    Historical Provider, MD  levothyroxine (SYNTHROID, LEVOTHROID) 137 MCG tablet Take 137 mcg by mouth daily before breakfast.    Historical Provider, MD  meclizine (ANTIVERT) 25 MG tablet Take 25 mg by mouth daily as needed for dizziness.     Historical Provider, MD  Multiple Vitamins-Minerals (ALIVE MENS ENERGY PO) Take 1 tablet by mouth daily.    Historical Provider, MD  Probiotic Product (PFromberg Take 1 tablet by mouth daily.    Historical Provider, MD  SAleda E. Lutz Va Medical CenterWort 300 MG CAPS Take 300 mg by mouth daily.    Historical Provider, MD     No family history on file.  Social History   Social History  . Marital status: Married    Spouse name: N/A  . Number of children: N/A  . Years of education: N/A   Social History Main Topics  . Smoking status: Former SResearch scientist (life sciences) . Smokeless tobacco: Never Used     Comment: 26 years ago  . Alcohol use No  . Drug use: No  . Sexual activity: Yes   Other Topics Concern  . Not on file   Social History Narrative  . No narrative on file    Review of Systems: A 12 point ROS discussed and pertinent positives are indicated in the HPI above.  All other systems are negative.  Review of Systems  Constitutional: Negative.   Respiratory: Negative.   Cardiovascular: Negative.   Gastrointestinal: Negative.   Genitourinary: Negative.   Musculoskeletal: Negative.   Neurological: Negative.     Vital Signs: BP 111/73 (BP Location: Left Arm, Patient Position: Sitting, Cuff Size: Large)   Pulse 83   Temp 97.5 F (36.4 C) (Oral)   Resp 14   Ht _0  (1.88 m)   Wt 235 lb (106.6 kg)   SpO2 96%   BMI 30.17 kg/m   Physical Exam  Constitutional: He is oriented to person, place, and time. He appears well-developed and well-nourished. No distress.  Abdominal: Soft. He exhibits no distension. There is no tenderness. There is no guarding.  Neurological: He is alert and oriented to person, place, and time.  Skin: He is not diaphoretic.  Vitals reviewed.   Imaging: [place] Initial Medicare Screen  Result Date: 08/12/2016 CLINICAL DATA:  Screening for AAA.  History of tobacco use. EXAM: [place] MEDICARE SCREENING TECHNIQUE: Ultrasound examination of the abdominal aorta was performed as a screening evaluation for abdominal aortic aneurysm. COMPARISON:  CT 06/06/2016. FINDINGS: Maximum Diameter: 2.8 ABDOMINAL AORTA Proximal:   1.9 cm Mid:  2.8 cm Distal:  2.4 cm IMPRESSION: Mid abdominal aortic mild ectasia at 2.8 cm. Ectatic abdominal aorta at risk for aneurysm development. Recommend followup by ultrasound in 5 years. This recommendation follows ACR consensus guidelines: White Paper of the ACR Incidental Findings Committee II on Vascular Findings. J Am Coll Radiol 2013; 10:789-794. Electronically Signed   By: TMarcello Moores Register   On: 08/12/2016 08:35   Ct Abdomen W Wo Contrast  Result Date: 09/09/2016 CLINICAL DATA:  Status post cryoablation of  left renal mass. EXAM: CT ABDOMEN WITHOUT AND WITH CONTRAST TECHNIQUE: Multidetector CT imaging of the abdomen was performed following the standard protocol before and following the bolus administration of intravenous contrast. CONTRAST:  149m ISOVUE-300 IOPAMIDOL (ISOVUE-300) INJECTION 61% COMPARISON:  06/06/2016 FINDINGS: Lower chest: Calcified granuloma identified within the left upper lobe. Hepatobiliary: No focal liver abnormality is seen. No gallstones, gallbladder wall thickening, or biliary dilatation. Pancreas: Unremarkable. No pancreatic ductal dilatation or surrounding inflammatory changes. Spleen: Normal in size without focal abnormality. Adrenals/Urinary Tract: The adrenal glands appear normal. Cryo ablation defect arising from the posterior cortex of the inferior pole of right kidney is again noted, image 71 of series 16. No complicating features are identified. Soft tissue within the ablation zone measures 1.8 x 1.2 cm, image 72 of series 6. Previously 2.0 x 1.3 cm. Several punctate left renal calculi noted. No hydronephrosis. Left-sided renal sinus cysts are again identified. New cryo ablation zone is identified involving the posteromedial cortex of the inferior pole of left kidney, image 83 of series 6. Ablation zone measures 2.9 x 1.8 cm, image 83 of series 11. There is no significant enhancement within the zone to suggest residual tumor. No perinephric fluid collections  identified. There is no hydronephrosis. The renal artery and vein appear patent. Stomach/Bowel: Small to moderate hiatal hernia noted. Normal caliber of the small and large bowel loops. Vascular/Lymphatic: Aortic atherosclerosis. No enlarged lymph nodes. Other: No abdominal wall hernia or abnormality. Musculoskeletal: There is degenerative disc disease identified within the lumbar spine. IMPRESSION: 1. Technically successful cryo ablation of posteromedial lower pole left kidney lesion. No complicating features identified. 2. Continued decrease in size of cryo ablation zone involving the posterior cortex of the right kidney. 3.  Aortic Atherosclerosis (ICD10-I70.0). 4. Hiatal hernia Electronically Signed   By: TKerby MoorsM.D.   On: 09/09/2016 08:47    Labs:  CBC:  Recent Labs  06/04/16 1330 06/07/16 0414  WBC 8.5 11.2*  HGB 16.5 14.2  HCT 48.1 42.3  PLT 266 235    COAGS:  Recent Labs  06/04/16 1330  INR 1.02    BMP:  Recent Labs  03/25/16 0858 06/04/16 1330 06/07/16 0414 08/29/16 1028  NA  --  140 140  --   K  --  4.3 3.7  --   CL  --  108 109  --   CO2  --  28 27  --   GLUCOSE  --  112* 114*  --   BUN  --  21* 17 19  CALCIUM  --  9.6 8.9  --   CREATININE 0.90 1.08 1.01 1.04  1.04  GFRNONAA  --  >60 >60 72  72  GFRAA  --  >60 >60 84  84     Assessment and Plan:  I met with Mr. JSymeand reviewed the CT performed earlier today demonstrating an adequate nonenhancing zone of ablation encompassing the exophytic tumor emanating from the posterior cortex of the lower left kidney. There is no evidence of residual enhancement. No complication is evident. Posterior right renal ablation defect appears stable and there is no evidence of recurrent enhancing tumor. I recommended a follow-up CT in one year to evaluate both ablation sites.  Electronically Signed:Aletta EdouardT 09/09/2016, 4:26 PM   I spent a total of  15 Minutes in face to face in clinical consultation,  greater than 50% of which was counseling/coordinating care post cryoablation of bilateral renal neoplasms.

## 2016-09-12 ENCOUNTER — Encounter (HOSPITAL_COMMUNITY): Payer: Self-pay | Admitting: Emergency Medicine

## 2016-09-12 ENCOUNTER — Emergency Department (HOSPITAL_COMMUNITY)
Admission: EM | Admit: 2016-09-12 | Discharge: 2016-09-12 | Disposition: A | Discharge status: Home / Self Care | Payer: Medicare HMO | Attending: Emergency Medicine | Admitting: Emergency Medicine

## 2016-09-12 DIAGNOSIS — M79604 Pain in right leg: Secondary | ICD-10-CM | POA: Diagnosis not present

## 2016-09-12 DIAGNOSIS — J45909 Unspecified asthma, uncomplicated: Secondary | ICD-10-CM | POA: Insufficient documentation

## 2016-09-12 DIAGNOSIS — M79661 Pain in right lower leg: Secondary | ICD-10-CM | POA: Diagnosis present

## 2016-09-12 DIAGNOSIS — E039 Hypothyroidism, unspecified: Secondary | ICD-10-CM | POA: Diagnosis not present

## 2016-09-12 DIAGNOSIS — Z87891 Personal history of nicotine dependence: Secondary | ICD-10-CM | POA: Diagnosis not present

## 2016-09-12 DIAGNOSIS — Z79899 Other long term (current) drug therapy: Secondary | ICD-10-CM | POA: Insufficient documentation

## 2016-09-12 DIAGNOSIS — Z8546 Personal history of malignant neoplasm of prostate: Secondary | ICD-10-CM | POA: Insufficient documentation

## 2016-09-12 MED ORDER — APIXABAN 5 MG PO TABS
5.0000 mg | ORAL_TABLET | Freq: Once | ORAL | Status: AC
  Administered 2016-09-12: 5 mg via ORAL
  Filled 2016-09-12: qty 1

## 2016-09-12 MED ORDER — ONDANSETRON 8 MG PO TBDP
8.0000 mg | ORAL_TABLET | Freq: Once | ORAL | Status: AC
  Administered 2016-09-12: 8 mg via ORAL
  Filled 2016-09-12: qty 1

## 2016-09-12 MED ORDER — HYDROCODONE-ACETAMINOPHEN 5-325 MG PO TABS
2.0000 | ORAL_TABLET | Freq: Once | ORAL | Status: AC
  Administered 2016-09-12: 2 via ORAL
  Filled 2016-09-12: qty 2

## 2016-09-12 NOTE — ED Provider Notes (Signed)
[facility] DEPT Provider Note   CSN: [id number] Arrival date & time: 09/12/16  1654   By signing my name below, I, Eric Crosby, attest that this documentation has been prepared under the direction and in the presence of [name], MD. Electronically Signed: Soijett Crosby, ED Scribe. 09/12/16. 11:32 PM.  History   Chief Complaint Chief Complaint  Patient presents with  . Leg Pain    HPI Eric Crosby is a 71 y.o. male with a PMHx of prostate CA, who presents to the Emergency Department complaining of 9/10, intermittent, throbbing, right lower leg pain onset this morning. Pt reports associated right lower thigh pain. Pt has tried advil, an ace bandage, and cane with no relief of his symptoms. He states that he was referred to the ED by his PCP at [facility] for further evaluation of his right calf pain. Pt notes that his right calf pain radiates to his right lower thigh and is worsened with weight bearing. Pt states that he has had labs completed within the past couple weeks that resulted as normal. He denies leg swelling, color change, wound, fever, vomiting, CP, SOB, blood in stool, numbness, weakness, recent insect bites, recent injury, recent trauma, and any other symptoms. Pt denies PMHx of blood clots or anticoagulant use.  No recent surgical procedures in past 2 months   The history is provided by the patient. No language interpreter was used.  Leg Pain   This is a new problem. The current episode started 12 to 24 hours ago. The problem occurs rarely. The problem has not changed since onset.The pain is present in the right lower leg. The quality of the pain is described as intermittent (throbbing). The pain is at a severity of 9/10. The pain is moderate. Pertinent negatives include no numbness. The symptoms are aggravated by standing. He has tried OTC pain medications (advil) for the symptoms. The treatment provided no relief. There has been no history of extremity  trauma.    Past Medical History:  Diagnosis Date  . Anxiety    hx of  . Arthritis    hx of  . Asthma    seasonal chronic  . Bipolar disorder ([place])    hx of  . Blood dyscrasia    POLYCYTHEMIA- NO MEDICATION OR TREATMENT NEEDED - PT TOLD TO MAKE EVERYONE AWARE HIS RBC CT HIGH - BUT NORMAL FOR HIM  . Complication of anesthesia   . Difficult intubation    Mask ventilation with an oral airway, grade III view with a MAC 4 and unable to pass bougie, grade II view with a glidescope. Recommend using glidescope for future intubations  . Diverticulosis    COLITIS  . History of kidney stones   . Hx of wheezing    OCCAS WHEEZING - POSS RELATED TO ALLERGIES OR ENVIRONMENTAL ISSUES-HAVE INHALER BUT HAS RARELY NEEDED  . Hypothyroidism   . 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 Prostate cancer ([place]) 2011   TX'D WITH SEED IMPLANT  . Right renal mass   . Sleep apnea    PT USED CPAP FOR SEVERAL YEARS BUT IT MADE HIS EYE PROBLEM WORSE AND HE CAN NOT USE THE MACHINE NOW.    Patient Active Problem List   Diagnosis Date Noted  . Left renal mass 06/06/2016  . Renal mass, left   . Right renal mass 01/27/2014  Past Surgical History:  Procedure Laterality Date  . colonoscopy    . EYE SURGERY     x 20  . eyelid surgery     17 SURGICAL PROCEDURES BLATERAL UPPER EYE LIDS  . HERNIA REPAIR     UMBILICAL  AND LOWER ABDOMINAL HERNIA REPAIR  . IR GENERIC HISTORICAL  03/25/2016   IR RADIOLOGIST EVAL & MGMT 03/25/2016 Aletta Edouard, MD [facility] INTER[facility] RAD  . IR GENERIC HISTORICAL  07/11/2014   IR RADIOLOGIST EVAL & MGMT 07/11/2014 Aletta Edouard, MD [facility] INTER[facility] RAD  . IR GENERIC HISTORICAL  07/09/2016   IR RADIOLOGIST EVAL & MGMT 07/09/2016 Aletta Edouard, MD [facility] INTER[facility] RAD  . NECK SURGERY     CERVICAL FUSION - PT STATES SLIGHT LIMITED ROM  . RADIOLOGY WITH ANESTHESIA Left 06/06/2016   Procedure: left  renal cryoablation;  Surgeon: Aletta Edouard, MD;  Location: [facility];  Service: Radiology;  Laterality: Left;  . RIGHT CATARACT EXTRACTION WITH LENS IMPLANT    . RIGHT RETINAL SURGERY FOR BUCKLE    . SEED IMPLANT FOR PROSTATE CANCER    . WISDOM TOOTH EXTRACTION         Home Medications    Prior to Admission medications   Medication Sig Start Date End Date Taking? Authorizing Provider  albuterol (PROVENTIL HFA;VENTOLIN HFA) 108 (90 BASE) MCG/ACT inhaler Inhale 2 puffs into the lungs every 4 (four) hours as needed for wheezing or shortness of breath.    Yes [provider]  ALPRAZolam (XANAX) 0.25 MG tablet Take 0.125 mg by mouth at bedtime as needed for anxiety.    Yes [provider]  ARTIFICIAL TEARS 0.1-0.3 % SOLN Place 1 drop into both eyes as needed for dry eyes.   Yes [provider]  Ibuprofen (ADVIL) 200 MG CAPS Take 400-600 mg by mouth See admin instructions. Pt takes three tablets in the morning (600 mg) and two tablets at night (484m)   Yes [provider]  levothyroxine (SYNTHROID, LEVOTHROID) 137 MCG tablet Take 137 mcg by mouth daily before breakfast.   Yes [provider]  Multiple Vitamins-Minerals (ALIVE MENS ENERGY PO) Take 1 tablet by mouth daily.   Yes [provider]  pantoprazole (PROTONIX) 40 MG tablet Take 40 mg by mouth every evening.  07/29/16  Yes [provider]  [place] Wort 300 MG CAPS Take 300 mg by mouth daily.   Yes [provider]  calcium carbonate (TUMS - DOSED IN MG ELEMENTAL CALCIUM) 500 MG chewable tablet Chew 2 tablets by mouth 2 (two) times daily as needed for indigestion or heartburn.    [provider]  Carboxymethylcell-Hypromellose (GENTEAL OP) Apply 1 application to eye as needed (dry eyes).    [provider]  meclizine (ANTIVERT) 25 MG tablet Take 25 mg by mouth daily as needed for dizziness.     [provider]    Family History History  reviewed. No pertinent family history.  Social History Social History  Substance Use Topics  . Smoking status: Former SResearch scientist (life sciences) . Smokeless tobacco: Never Used     Comment: 26 years ago  . Alcohol use No     Allergies   Adhesive [tape] and Sulfa antibiotics   Review of Systems Review of Systems  Constitutional: Negative for fever.  Respiratory: Negative for shortness of breath.   Cardiovascular: Negative for chest pain and leg swelling.  Gastrointestinal: Negative for blood in stool.  Musculoskeletal: Positive for myalgias (right calf and right thigh).  Skin: Negative for color change and wound.  Neurological: Negative for weakness and numbness.  All other systems reviewed and are negative.    Physical Exam Updated Vital Signs BP (!) 157/89 (BP Location: Left Arm)   Pulse 65   Temp 97.5 F (36.4 C) (Oral)   Resp 18   SpO2 98%   Physical Exam  CONSTITUTIONAL: Well developed/well nourished HEAD: Normocephalic/atraumatic EYES: EOMI ENMT: Mucous membranes moist NECK: supple no meningeal signs SPINE/BACK:entire spine nontender CV: S1/S2 noted, no murmurs/rubs/gallops noted LUNGS: Lungs are clear to auscultation bilaterally, no apparent distress ABDOMEN: soft, nontender, no rebound or guarding, bowel sounds noted throughout abdomen GU:no cva tenderness NEURO: Pt is awake/alert/appropriate, moves all extremitiesx4.  No facial droop.   EXTREMITIES: pulses normal/equal, full ROM. Right calf tenderness. Mild right thigh tenderness. No erythema. Minimal edema noted. Distal pulses equal and intact. No crepitus.  No deformity SKIN: warm, color normal PSYCH: no abnormalities of mood noted, alert and oriented to situation    ED Treatments / Results  DIAGNOSTIC STUDIES: Oxygen Saturation is 98% on RA, nl by my interpretation.    COORDINATION OF CARE: 11:33 PM Discussed treatment plan with pt at bedside which includes norco, eliquis, follow up in the morning for [place] and pt  agreed to plan.   Labs (all labs ordered are listed, but only abnormal results are displayed) Labs Reviewed - No data to display  EKG  EKG Interpretation None       Radiology No results found.  Procedures Procedures (including critical care time)  Medications Ordered in ED Medications  HYDROcodone-acetaminophen (NORCO/VICODIN) 5-325 MG per tablet 2 tablet (2 tablets Oral Given 09/12/16 2330)  apixaban (ELIQUIS) tablet 5 mg (5 mg Oral Given 09/12/16 2353)  ondansetron (ZOFRAN-ODT) disintegrating tablet 8 mg (8 mg Oral Given 09/12/16 2353)     Initial Impression / Assessment and Plan / ED Course  I have reviewed the triage vital signs and the nursing notes.   Pt well appearing Possible DVT Will need Venous US Will start with eliquis one time for anticoagulation and he will return to [facility] on 5/5 for ultrasound Since he reports recent normal labs, those were deferred He has no recent surgical procedure or h/o GI bleed recently   Final Clinical Impressions(s) / ED Diagnoses   Final diagnoses:  Right leg pain    New Prescriptions New Prescriptions   No medications on file   I personally performed the services described in this documentation, which was scribed in my presence. The recorded information has been reviewed and is accurate.        [name], MD 09/13/16 [phone]

## 2016-09-12 NOTE — ED Triage Notes (Signed)
Pt reports R leg pain for the since this am. Pain began in R lateral calf pain and has spread to thigh. Pt ambulatory with cane. No known injury. No recent travel.

## 2016-09-12 NOTE — Discharge Instructions (Signed)
PLEASE GO TO [place] BY 8AM ON Saturday GO TO THE ER AND YOU SHOULD BE REGISTERED TO GO FOR ULTRASOUND

## 2016-09-13 ENCOUNTER — Ambulatory Visit (HOSPITAL_COMMUNITY)
Admission: RE | Admit: 2016-09-13 | Discharge: 2016-09-13 | Disposition: A | Discharge status: Home / Self Care | Payer: Medicare HMO | Source: Ambulatory Visit | Attending: Emergency Medicine | Admitting: Emergency Medicine

## 2016-09-13 DIAGNOSIS — M79609 Pain in unspecified limb: Secondary | ICD-10-CM

## 2016-09-13 DIAGNOSIS — M79604 Pain in right leg: Secondary | ICD-10-CM | POA: Diagnosis not present

## 2016-09-13 NOTE — Progress Notes (Signed)
VASCULAR LAB PRELIMINARY  PRELIMINARY  PRELIMINARY  PRELIMINARY  Right lower extremity venous duplex completed.    Preliminary report:  There is no DVT or SVT noted in the right lower extremity.   Takyla Kuchera, RVT 09/13/2016, 9:06 AM

## 2016-09-14 ENCOUNTER — Encounter (HOSPITAL_BASED_OUTPATIENT_CLINIC_OR_DEPARTMENT_OTHER): Payer: Self-pay | Admitting: *Deleted

## 2016-09-14 ENCOUNTER — Emergency Department (HOSPITAL_BASED_OUTPATIENT_CLINIC_OR_DEPARTMENT_OTHER)
Admission: EM | Admit: 2016-09-14 | Discharge: 2016-09-14 | Disposition: A | Discharge status: Home / Self Care | Payer: Medicare HMO | Attending: Dermatology | Admitting: Dermatology

## 2016-09-14 DIAGNOSIS — M79604 Pain in right leg: Secondary | ICD-10-CM | POA: Insufficient documentation

## 2016-09-14 DIAGNOSIS — J45909 Unspecified asthma, uncomplicated: Secondary | ICD-10-CM | POA: Diagnosis not present

## 2016-09-14 DIAGNOSIS — E039 Hypothyroidism, unspecified: Secondary | ICD-10-CM | POA: Insufficient documentation

## 2016-09-14 DIAGNOSIS — Z5321 Procedure and treatment not carried out due to patient leaving prior to being seen by health care provider: Secondary | ICD-10-CM | POA: Insufficient documentation

## 2016-09-14 DIAGNOSIS — Z87891 Personal history of nicotine dependence: Secondary | ICD-10-CM | POA: Diagnosis not present

## 2016-09-14 NOTE — ED Triage Notes (Signed)
Pt reports R leg pain that began this past Friday. Denies chest pain, sob, known injury. Pt reports getting ultrasound at [facility] and r/o DVT. Pt ambulatory with walker.

## 2016-09-15 DIAGNOSIS — M79604 Pain in right leg: Secondary | ICD-10-CM | POA: Diagnosis not present

## 2016-09-18 DIAGNOSIS — M5441 Lumbago with sciatica, right side: Secondary | ICD-10-CM | POA: Diagnosis not present

## 2016-09-18 DIAGNOSIS — M545 Low back pain: Secondary | ICD-10-CM | POA: Diagnosis not present

## 2016-09-18 DIAGNOSIS — M48062 Spinal stenosis, lumbar region with neurogenic claudication: Secondary | ICD-10-CM | POA: Diagnosis not present

## 2016-09-25 DIAGNOSIS — M545 Low back pain: Secondary | ICD-10-CM | POA: Diagnosis not present

## 2016-09-30 DIAGNOSIS — M545 Low back pain: Secondary | ICD-10-CM | POA: Diagnosis not present

## 2016-10-03 DIAGNOSIS — M545 Low back pain: Secondary | ICD-10-CM | POA: Diagnosis not present

## 2016-10-03 DIAGNOSIS — M5416 Radiculopathy, lumbar region: Secondary | ICD-10-CM | POA: Diagnosis not present

## 2016-10-07 DIAGNOSIS — M5416 Radiculopathy, lumbar region: Secondary | ICD-10-CM | POA: Diagnosis not present

## 2016-10-07 DIAGNOSIS — M545 Low back pain: Secondary | ICD-10-CM | POA: Diagnosis not present

## 2016-10-10 DIAGNOSIS — M5416 Radiculopathy, lumbar region: Secondary | ICD-10-CM | POA: Diagnosis not present

## 2016-10-13 NOTE — Anesthesia Postprocedure Evaluation (Signed)
Anesthesia Post Note  Patient: Eric Crosby  Procedure(s) Performed: Procedure(s) (LRB): left renal cryoablation (Left)     Anesthesia Post Evaluation  Last Vitals:  Vitals:   06/07/16 0002 06/07/16 0507  BP:  115/67  Pulse: 99 100  Resp:  18  Temp:  37.3 C    Last Pain:  Vitals:   06/07/16 0507  TempSrc: Oral                 Nettie Cromwell S

## 2016-10-13 NOTE — Addendum Note (Signed)
Addendum  created 10/13/16 1027 by Myrtie Soman, MD   Sign clinical note

## 2016-10-24 ENCOUNTER — Encounter: Payer: Self-pay | Admitting: Interventional Radiology

## 2016-12-01 DIAGNOSIS — L121 Cicatricial pemphigoid: Secondary | ICD-10-CM | POA: Diagnosis not present

## 2016-12-01 DIAGNOSIS — Z961 Presence of intraocular lens: Secondary | ICD-10-CM | POA: Diagnosis not present

## 2016-12-01 DIAGNOSIS — H02054 Trichiasis without entropian left upper eyelid: Secondary | ICD-10-CM | POA: Diagnosis not present

## 2016-12-01 DIAGNOSIS — H02051 Trichiasis without entropian right upper eyelid: Secondary | ICD-10-CM | POA: Diagnosis not present

## 2016-12-01 DIAGNOSIS — H2512 Age-related nuclear cataract, left eye: Secondary | ICD-10-CM | POA: Diagnosis not present

## 2016-12-02 DIAGNOSIS — R062 Wheezing: Secondary | ICD-10-CM | POA: Diagnosis not present

## 2016-12-02 DIAGNOSIS — K219 Gastro-esophageal reflux disease without esophagitis: Secondary | ICD-10-CM | POA: Diagnosis not present

## 2016-12-02 DIAGNOSIS — R69 Illness, unspecified: Secondary | ICD-10-CM | POA: Diagnosis not present

## 2016-12-02 DIAGNOSIS — L121 Cicatricial pemphigoid: Secondary | ICD-10-CM | POA: Diagnosis not present

## 2016-12-02 DIAGNOSIS — J45998 Other asthma: Secondary | ICD-10-CM | POA: Diagnosis not present

## 2016-12-02 DIAGNOSIS — R251 Tremor, unspecified: Secondary | ICD-10-CM | POA: Diagnosis not present

## 2016-12-11 DIAGNOSIS — R062 Wheezing: Secondary | ICD-10-CM | POA: Diagnosis not present

## 2016-12-11 DIAGNOSIS — J4530 Mild persistent asthma, uncomplicated: Secondary | ICD-10-CM | POA: Diagnosis not present

## 2016-12-17 DIAGNOSIS — H2512 Age-related nuclear cataract, left eye: Secondary | ICD-10-CM | POA: Diagnosis not present

## 2016-12-17 DIAGNOSIS — H02051 Trichiasis without entropian right upper eyelid: Secondary | ICD-10-CM | POA: Diagnosis not present

## 2016-12-17 DIAGNOSIS — L121 Cicatricial pemphigoid: Secondary | ICD-10-CM | POA: Diagnosis not present

## 2016-12-17 DIAGNOSIS — Z961 Presence of intraocular lens: Secondary | ICD-10-CM | POA: Diagnosis not present

## 2016-12-17 DIAGNOSIS — H02054 Trichiasis without entropian left upper eyelid: Secondary | ICD-10-CM | POA: Diagnosis not present

## 2016-12-31 DIAGNOSIS — B370 Candidal stomatitis: Secondary | ICD-10-CM | POA: Diagnosis not present

## 2017-01-13 DIAGNOSIS — R911 Solitary pulmonary nodule: Secondary | ICD-10-CM | POA: Diagnosis not present

## 2017-01-13 DIAGNOSIS — R938 Abnormal findings on diagnostic imaging of other specified body structures: Secondary | ICD-10-CM | POA: Diagnosis not present

## 2017-01-19 ENCOUNTER — Other Ambulatory Visit: Payer: Self-pay | Admitting: Family Medicine

## 2017-01-19 DIAGNOSIS — R911 Solitary pulmonary nodule: Secondary | ICD-10-CM

## 2017-01-20 ENCOUNTER — Ambulatory Visit
Admission: RE | Admit: 2017-01-20 | Discharge: 2017-01-20 | Disposition: A | Discharge status: Home / Self Care | Payer: Medicare HMO | Source: Ambulatory Visit | Attending: Family Medicine | Admitting: Family Medicine

## 2017-01-20 DIAGNOSIS — R911 Solitary pulmonary nodule: Secondary | ICD-10-CM

## 2017-01-20 MED ORDER — IOPAMIDOL (ISOVUE-300) INJECTION 61%
75.0000 mL | Freq: Once | INTRAVENOUS | Status: AC | PRN
  Administered 2017-01-20: 75 mL via INTRAVENOUS

## 2017-01-27 DIAGNOSIS — M5416 Radiculopathy, lumbar region: Secondary | ICD-10-CM | POA: Diagnosis not present

## 2017-01-28 DIAGNOSIS — Z961 Presence of intraocular lens: Secondary | ICD-10-CM | POA: Diagnosis not present

## 2017-01-28 DIAGNOSIS — H2512 Age-related nuclear cataract, left eye: Secondary | ICD-10-CM | POA: Diagnosis not present

## 2017-01-28 DIAGNOSIS — L121 Cicatricial pemphigoid: Secondary | ICD-10-CM | POA: Diagnosis not present

## 2017-01-28 DIAGNOSIS — H02051 Trichiasis without entropian right upper eyelid: Secondary | ICD-10-CM | POA: Diagnosis not present

## 2017-01-28 DIAGNOSIS — H02054 Trichiasis without entropian left upper eyelid: Secondary | ICD-10-CM | POA: Diagnosis not present

## 2017-01-29 ENCOUNTER — Encounter: Payer: Self-pay | Admitting: Oncology

## 2017-01-29 ENCOUNTER — Telehealth: Payer: Self-pay | Admitting: Oncology

## 2017-01-29 NOTE — Telephone Encounter (Signed)
Appt has been scheduled to see Dr. Alen Blew on 10/2 at 11am. Pt aware to arrive 30 minutes early. Letter mailed.

## 2017-02-02 DIAGNOSIS — H02054 Trichiasis without entropian left upper eyelid: Secondary | ICD-10-CM | POA: Diagnosis not present

## 2017-02-02 DIAGNOSIS — H2512 Age-related nuclear cataract, left eye: Secondary | ICD-10-CM | POA: Diagnosis not present

## 2017-02-02 DIAGNOSIS — Z961 Presence of intraocular lens: Secondary | ICD-10-CM | POA: Diagnosis not present

## 2017-02-02 DIAGNOSIS — L121 Cicatricial pemphigoid: Secondary | ICD-10-CM | POA: Diagnosis not present

## 2017-02-02 DIAGNOSIS — H02051 Trichiasis without entropian right upper eyelid: Secondary | ICD-10-CM | POA: Diagnosis not present

## 2017-02-03 DIAGNOSIS — M5416 Radiculopathy, lumbar region: Secondary | ICD-10-CM | POA: Diagnosis not present

## 2017-02-09 DIAGNOSIS — H524 Presbyopia: Secondary | ICD-10-CM | POA: Diagnosis not present

## 2017-02-09 DIAGNOSIS — H5213 Myopia, bilateral: Secondary | ICD-10-CM | POA: Diagnosis not present

## 2017-02-09 DIAGNOSIS — H52203 Unspecified astigmatism, bilateral: Secondary | ICD-10-CM | POA: Diagnosis not present

## 2017-02-10 ENCOUNTER — Telehealth: Payer: Self-pay | Admitting: Oncology

## 2017-02-10 ENCOUNTER — Ambulatory Visit (HOSPITAL_BASED_OUTPATIENT_CLINIC_OR_DEPARTMENT_OTHER): Payer: Medicare HMO | Admitting: Oncology

## 2017-02-10 VITALS — BP 135/81 | HR 77 | Temp 98.0°F | Resp 18 | Ht 74.0 in | Wt 234.9 lb

## 2017-02-10 DIAGNOSIS — Z8546 Personal history of malignant neoplasm of prostate: Secondary | ICD-10-CM | POA: Diagnosis not present

## 2017-02-10 DIAGNOSIS — R918 Other nonspecific abnormal finding of lung field: Secondary | ICD-10-CM | POA: Diagnosis not present

## 2017-02-10 DIAGNOSIS — Z8553 Personal history of malignant neoplasm of renal pelvis: Secondary | ICD-10-CM

## 2017-02-10 DIAGNOSIS — C649 Malignant neoplasm of unspecified kidney, except renal pelvis: Secondary | ICD-10-CM

## 2017-02-10 NOTE — Progress Notes (Signed)
Reason for Referral: Lung mass   HPI: 71 year old gentleman currently of [place] where he lived all his life. He has history of prostate cancer dating back to 2010 and at that time he was treated with brachytherapy under the care of Dr. Valere Dross. He remains disease-free and follows with Dr. Risa Grill regarding this issue. He also had a CT-guided cryoablation of a right kidney mass done by Dr. Kathlene Cote in 2015. At that time he was found to have a 13 mm enhancing mass from the posterior lower pole of the right kidney. He had a repeat cryoablation in January 2018 of a left kidney mass done in January 2018. These lesions were too small to biopsy at that time. He started developing respiratory symptoms consistent with reactive airway disease and was treated with multiple inhalers. He subsequently had a CT scan done by his primary care provider on 01/20/2017. His CT scan showed a 7 mm noncalcified nodule involving the right upper lobe of the lung with a smaller adjacent right upper lobe nodule. No other abnormalities was noted on the scan. He denied any other complaints at this time. He denies any cough, wheezing or shortness of breath. He remains active and continues to attend to her activities of daily living. He has remote history of secondary polycythemia which is no longer an issue at this time.  He does not report any headaches, blurry vision, syncope or seizures. He does not report any fevers, chills or sweats. He does not report any cough, wheezing or hemoptysis. He does not report any nausea, vomiting or abdominal pain. He does not report any chest pain, palpitation or leg edema. He does not report any frequency urgency or hesitancy. He does not report any skeletal complaints. Remaining review of systems unremarkable.   Past Medical History:  Diagnosis Date  . Anxiety    hx of  . Arthritis    hx of  . Asthma    seasonal chronic  . Bipolar disorder ([place])    hx of  . Blood dyscrasia    POLYCYTHEMIA- NO MEDICATION OR TREATMENT NEEDED - PT TOLD TO MAKE EVERYONE AWARE HIS RBC CT HIGH - BUT NORMAL FOR HIM  . Complication of anesthesia   . Difficult intubation    Mask ventilation with an oral airway, grade III view with a MAC 4 and unable to pass bougie, grade II view with a glidescope. Recommend using glidescope for future intubations  . Diverticulosis    COLITIS  . History of kidney stones   . Hx of wheezing    OCCAS WHEEZING - POSS RELATED TO ALLERGIES OR ENVIRONMENTAL ISSUES-HAVE INHALER BUT HAS RARELY NEEDED  . Hypothyroidism   . 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 Prostate cancer ([place]) 2011   TX'D WITH SEED IMPLANT  . Right renal mass   . Sleep apnea    PT USED CPAP FOR SEVERAL YEARS BUT IT MADE HIS EYE PROBLEM WORSE AND HE CAN NOT USE THE MACHINE NOW.  :  Past Surgical History:  Procedure Laterality Date  . colonoscopy    . EYE SURGERY     x 20  . eyelid surgery     17 SURGICAL PROCEDURES BLATERAL UPPER EYE LIDS  . HERNIA REPAIR     UMBILICAL  AND LOWER ABDOMINAL HERNIA REPAIR  . IR GENERIC HISTORICAL  03/25/2016   IR RADIOLOGIST EVAL & MGMT 03/25/2016 Eulas Post  Kathlene Cote, MD [facility] INTERV RAD  . IR GENERIC HISTORICAL  07/11/2014   IR RADIOLOGIST EVAL & MGMT 07/11/2014 Aletta Edouard, MD [facility] INTERV RAD  . IR GENERIC HISTORICAL  07/09/2016   IR RADIOLOGIST EVAL & MGMT 07/09/2016 Aletta Edouard, MD [facility] INTER[facility] RAD  . IR RADIOLOGIST EVAL & MGMT  09/09/2016  . NECK SURGERY     CERVICAL FUSION - PT STATES SLIGHT LIMITED ROM  . RADIOLOGY WITH ANESTHESIA Left 06/06/2016   Procedure: left renal cryoablation;  Surgeon: Aletta Edouard, MD;  Location: [facility];  Service: Radiology;  Laterality: Left;  . RIGHT CATARACT EXTRACTION WITH LENS IMPLANT    . RIGHT RETINAL SURGERY FOR BUCKLE    . SEED IMPLANT FOR PROSTATE CANCER    . WISDOM TOOTH EXTRACTION    :   Current  Outpatient Prescriptions:  .  albuterol (PROVENTIL HFA;VENTOLIN HFA) 108 (90 BASE) MCG/ACT inhaler, Inhale 2 puffs into the lungs every 4 (four) hours as needed for wheezing or shortness of breath. , Disp: , Rfl:  .  ALPRAZolam (XANAX) 0.25 MG tablet, Take 0.125 mg by mouth at bedtime as needed for anxiety. , Disp: , Rfl:  .  ARTIFICIAL TEARS 0.1-0.3 % SOLN, Place 1 drop into both eyes as needed for dry eyes., Disp: , Rfl:  .  calcium carbonate (TUMS - DOSED IN MG ELEMENTAL CALCIUM) 500 MG chewable tablet, Chew 2 tablets by mouth 2 (two) times daily as needed for indigestion or heartburn., Disp: , Rfl:  .  Carboxymethylcell-Hypromellose (GENTEAL OP), Apply 1 application to eye as needed (dry eyes)., Disp: , Rfl:  .  Ibuprofen (ADVIL) 200 MG CAPS, Take 400-600 mg by mouth See admin instructions. Pt takes three tablets in the morning (600 mg) and two tablets at night (480m), Disp: , Rfl:  .  levothyroxine (SYNTHROID, LEVOTHROID) 137 MCG tablet, Take 137 mcg by mouth daily before breakfast., Disp: , Rfl:  .  meclizine (ANTIVERT) 25 MG tablet, Take 25 mg by mouth daily as needed for dizziness. , Disp: , Rfl:  .  Multiple Vitamins-Minerals (ALIVE MENS ENERGY PO), Take 1 tablet by mouth daily., Disp: , Rfl:  .  pantoprazole (PROTONIX) 40 MG tablet, Take 40 mg by mouth every evening. , Disp: , Rfl:  .  St Johns Wort 300 MG CAPS, Take 300 mg by mouth daily., Disp: , Rfl: :  Allergies  Allergen Reactions  . Adhesive [Tape] Swelling and Dermatitis  . Sulfa Antibiotics Other (See Comments)    Childhood reaction  Unknown   :  No family history on file.:  Social History   Social History  . Marital status: Married    Spouse name: N/A  . Number of children: N/A  . Years of education: N/A   Occupational History  . Not on file.   Social History Main Topics  . Smoking status: Former SResearch scientist (life sciences) . Smokeless tobacco: Never Used     Comment: 26 years ago  . Alcohol use No  . Drug use: No  . Sexual  activity: Yes   Other Topics Concern  . Not on file   Social History Narrative  . No narrative on file  :  Pertinent items are noted in HPI.  Exam: Blood pressure 135/81, pulse 77, temperature 98 F (36.7 C), temperature source Oral, resp. rate 18, height _0  (1.88 m), weight 234 lb 14.4 oz (106.5 kg), SpO2 96 %. General appearance: alert and cooperative appeared without distress. Throat: No oral thrush or ulcers. Neck:  no adenopathy or masses. Resp: clear to auscultation bilaterally without wheezes or dullness to percussion. Chest wall: no tenderness Cardio: regular rate and rhythm, S1, S2 normal, no murmur, click, rub or gallop GI: soft, non-tender; bowel sounds normal; no masses,  no organomegaly Extremities: extremities normal, atraumatic, no cyanosis or edema Pulses: 2+ and symmetric Skin: Skin color, texture, turgor normal. No rashes or lesions Lymph nodes: Cervical, supraclavicular, and axillary nodes normal.  CBC    Component Value Date/Time   WBC 11.2 (H) 06/07/2016 0414   RBC 4.58 06/07/2016 0414   HGB 14.2 06/07/2016 0414   HCT 42.3 06/07/2016 0414   PLT 235 06/07/2016 0414   MCV 92.4 06/07/2016 0414   MCH 31.0 06/07/2016 0414   MCHC 33.6 06/07/2016 0414   RDW 14.7 06/07/2016 0414   LYMPHSABS 2.6 06/04/2016 1330   MONOABS 0.8 06/04/2016 1330   EOSABS 0.3 06/04/2016 1330   BASOSABS 0.0 06/04/2016 1330     Chemistry      Component Value Date/Time   NA 140 06/07/2016 0414   K 3.7 06/07/2016 0414   CL 109 06/07/2016 0414   CO2 27 06/07/2016 0414   BUN 19 08/29/2016 1028   CREATININE 1.04 08/29/2016 1028   CREATININE 1.04 08/29/2016 1028      Component Value Date/Time   CALCIUM 8.9 06/07/2016 0414   ALKPHOS 89 01/20/2014 1000   AST 28 01/20/2014 1000   ALT 27 01/20/2014 1000   BILITOT 0.6 01/20/2014 1000      Ct Chest W Contrast  Result Date: 01/20/2017 CLINICAL DATA:  71 year old with personal history of prostate cancer treated with  brachytherapy, personal history of cryoablation of a left renal cell carcinoma in January, 2018, personal history of cryoablation of right renal cell carcinoma in 2015. Evaluate for possible new left upper lobe lung nodule identified on recent chest x-rays. EXAM: CT CHEST WITH CONTRAST TECHNIQUE: Multidetector CT imaging of the chest was performed during intravenous contrast administration. CONTRAST:  24m ISOVUE-300 IOPAMIDOL INJECTION 61% IV. COMPARISON:  No prior CT chest. Visualized lung bases on multiple prior CT abdomen pelvis examinations most recently 09/09/2016. Multiple prior chest x-rays, most recently 01/13/2017, 12/11/2016. FINDINGS: Cardiovascular: Normal heart size. Moderate LAD and left circumflex coronary atherosclerosis. No pericardial effusion. Aortic annular calcification. Moderate atherosclerosis involving the thoracic and upper abdominal aorta. Aneurysmal dilation of the ascending thoracic aorta measuring up to 4.6 cm diameter. Atherosclerosis involving the proximal great vessels without evidence of stenosis. Mediastinum/Nodes: Small hiatal hernia. No pathologically enlarged mediastinal, hilar or axillary lymph nodes. No mediastinal masses. Normal-appearing esophagus. Thyroid gland atrophic and otherwise unremarkable. Lungs/Pleura: In the lateral right upper lobe there is a 7 x 7 mm nodule (7 mm mean diameter) on image 36 of series 4. Adjacent smaller noncalcified nodule immediately inferior to the dominant nodule, best seen on coronal image 74. No noncalcified nodules elsewhere in either lung. Calcified granuloma in the lingula. Emphysematous changes throughout both lungs. No confluent airspace consolidation. Central airways patent. No pleural effusions. Subpleural fat extending into the major fissure on the right. Upper Abdomen: Mild pancreatic atrophy with fatty replacement. Diverticulum arising from the splenic flexure of the colon. Remaining visualized upper abdomen unremarkable.  Musculoskeletal: Prior lower cervical fusion. Mild degenerative disc disease and spondylosis involving the thoracic spine. No evidence of osseous metastatic disease. IMPRESSION: 1. Approximate 7 mm noncalcified nodule involving the right upper lobe with a smaller adjacent right upper lobe nodule. No noncalcified nodules elsewhere in either lung. Metastatic disease is favored. Non-contrast  chest CT at 3-6 months is recommended. If the nodules are stable at time of repeat CT, then future CT at 18-24 months (from today's scan) is considered optional for low-risk patients, but is recommended for high-risk patients. This recommendation follows the consensus statement: Guidelines for Management of Incidental Pulmonary Nodules Detected on CT Images: From the Fleischner Society 2017; Radiology 2017; 284:228-243. 2. COPD/emphysema.  No acute cardiopulmonary disease. 3. Small hiatal hernia. 4. Thoracic and upper abdominal aortic atherosclerosis. Ascending thoracic aortic aneurysm measuring 4.6 cm diameter. Recommend semi-annual imaging followup by CTA or MRA and referral to cardiothoracic surgery if not already obtained. This recommendation follows 2010 ACCF/AHA/AATS/ACR/ASA/SCA/SCAI/SIR/STS/SVM Guidelines for the Diagnosis and Management of Patients With Thoracic Aortic Disease. Circulation. 2010; 121: J092-H574. 5. Upper abdominal findings of mild pancreatic atrophy and proximal descending colon diverticulosis. Aortic aneurysm NOS (ICD10-I71.9). Aortic Atherosclerosis (ICD10-I70.0) and Emphysema (ICD10-J43.9). Electronically Signed   By: Evangeline Dakin M.D.   On: 01/20/2017 10:24    Assessment and Plan:   71 year old gentleman with the following issues:  1. 7 mm noncalcified nodule noted in the right upper lobe of the lung with an adjacent smaller nodule. This was discovered on a CT scan obtained on 01/20/2017. No other abnormalities noted on this exam. CT scan of the abdomen and pelvis done in May 2018 showed no  evidence of any malignancy.  The differential diagnosis was discussed today with the patient. This would include a benign findings, primary lung neoplasm, metastatic renal cell carcinoma and less likely metastatic prostate cancer. The likelihood of metastatic disease at this time considered low and the appearance does not appear to be consistent with primary lung neoplasm.  Based on these findings have recommended repeating imaging studies in January 2019. And if these findings continues to social suspicious lesions then thoracic surgery consultation would be the except for primary surgical therapy. If the imaging studies showed stability then a repeat another scan in 18-24 months would be the next step.  2. Prostate cancer: He appears to be a remote without any evidence of recurrence. Continues to follow with Dr. Risa Grill at [facility] urology.  3. Renal cell carcinoma: status post cryoablation done 2015 and in 2018. No evidence of recurrence at this time based on imaging studies in May 2018.  4. Follow-up: Will be in 3 months after repeat imaging studies.

## 2017-02-10 NOTE — Telephone Encounter (Signed)
Scheduled appt per 10/2 los - Gave patient AVS and calender per los. Central radiology to contact patient with ct schedule.  

## 2017-02-13 DIAGNOSIS — D2221 Melanocytic nevi of right ear and external auricular canal: Secondary | ICD-10-CM | POA: Diagnosis not present

## 2017-02-13 DIAGNOSIS — L720 Epidermal cyst: Secondary | ICD-10-CM | POA: Diagnosis not present

## 2017-02-13 DIAGNOSIS — D1801 Hemangioma of skin and subcutaneous tissue: Secondary | ICD-10-CM | POA: Diagnosis not present

## 2017-02-13 DIAGNOSIS — D2261 Melanocytic nevi of right upper limb, including shoulder: Secondary | ICD-10-CM | POA: Diagnosis not present

## 2017-02-13 DIAGNOSIS — D225 Melanocytic nevi of trunk: Secondary | ICD-10-CM | POA: Diagnosis not present

## 2017-02-13 DIAGNOSIS — L821 Other seborrheic keratosis: Secondary | ICD-10-CM | POA: Diagnosis not present

## 2017-02-18 ENCOUNTER — Encounter: Payer: Medicare HMO | Admitting: Surgery

## 2017-03-09 DIAGNOSIS — H02054 Trichiasis without entropian left upper eyelid: Secondary | ICD-10-CM | POA: Diagnosis not present

## 2017-03-09 DIAGNOSIS — H02102 Unspecified ectropion of right lower eyelid: Secondary | ICD-10-CM | POA: Diagnosis not present

## 2017-03-09 DIAGNOSIS — H02105 Unspecified ectropion of left lower eyelid: Secondary | ICD-10-CM | POA: Diagnosis not present

## 2017-03-09 DIAGNOSIS — H02532 Eyelid retraction right lower eyelid: Secondary | ICD-10-CM | POA: Diagnosis not present

## 2017-03-09 DIAGNOSIS — H02103 Unspecified ectropion of right eye, unspecified eyelid: Secondary | ICD-10-CM | POA: Diagnosis not present

## 2017-03-09 DIAGNOSIS — H02106 Unspecified ectropion of left eye, unspecified eyelid: Secondary | ICD-10-CM | POA: Diagnosis not present

## 2017-03-09 DIAGNOSIS — H02214 Cicatricial lagophthalmos left upper eyelid: Secondary | ICD-10-CM | POA: Diagnosis not present

## 2017-03-09 DIAGNOSIS — H5789 Other specified disorders of eye and adnexa: Secondary | ICD-10-CM | POA: Diagnosis not present

## 2017-03-09 DIAGNOSIS — H02534 Eyelid retraction left upper eyelid: Secondary | ICD-10-CM | POA: Diagnosis not present

## 2017-03-09 DIAGNOSIS — L121 Cicatricial pemphigoid: Secondary | ICD-10-CM | POA: Diagnosis not present

## 2017-03-09 DIAGNOSIS — H02051 Trichiasis without entropian right upper eyelid: Secondary | ICD-10-CM | POA: Diagnosis not present

## 2017-03-09 DIAGNOSIS — H02211 Cicatricial lagophthalmos right upper eyelid: Secondary | ICD-10-CM | POA: Diagnosis not present

## 2017-03-09 DIAGNOSIS — H02531 Eyelid retraction right upper eyelid: Secondary | ICD-10-CM | POA: Diagnosis not present

## 2017-03-17 DIAGNOSIS — K219 Gastro-esophageal reflux disease without esophagitis: Secondary | ICD-10-CM | POA: Diagnosis not present

## 2017-03-17 DIAGNOSIS — Z23 Encounter for immunization: Secondary | ICD-10-CM | POA: Diagnosis not present

## 2017-04-15 DIAGNOSIS — H02214 Cicatricial lagophthalmos left upper eyelid: Secondary | ICD-10-CM | POA: Diagnosis not present

## 2017-04-15 DIAGNOSIS — Z79890 Hormone replacement therapy (postmenopausal): Secondary | ICD-10-CM | POA: Diagnosis not present

## 2017-04-15 DIAGNOSIS — H02001 Unspecified entropion of right upper eyelid: Secondary | ICD-10-CM | POA: Diagnosis not present

## 2017-04-15 DIAGNOSIS — H02534 Eyelid retraction left upper eyelid: Secondary | ICD-10-CM | POA: Diagnosis not present

## 2017-04-15 DIAGNOSIS — H02004 Unspecified entropion of left upper eyelid: Secondary | ICD-10-CM | POA: Diagnosis not present

## 2017-04-15 DIAGNOSIS — H02211 Cicatricial lagophthalmos right upper eyelid: Secondary | ICD-10-CM | POA: Diagnosis not present

## 2017-04-15 DIAGNOSIS — H02102 Unspecified ectropion of right lower eyelid: Secondary | ICD-10-CM | POA: Diagnosis not present

## 2017-04-15 DIAGNOSIS — G4733 Obstructive sleep apnea (adult) (pediatric): Secondary | ICD-10-CM | POA: Diagnosis not present

## 2017-04-15 DIAGNOSIS — H02104 Unspecified ectropion of left upper eyelid: Secondary | ICD-10-CM | POA: Diagnosis not present

## 2017-04-15 DIAGNOSIS — H02105 Unspecified ectropion of left lower eyelid: Secondary | ICD-10-CM | POA: Diagnosis not present

## 2017-04-15 DIAGNOSIS — Z79899 Other long term (current) drug therapy: Secondary | ICD-10-CM | POA: Diagnosis not present

## 2017-04-15 DIAGNOSIS — E039 Hypothyroidism, unspecified: Secondary | ICD-10-CM | POA: Diagnosis not present

## 2017-04-15 DIAGNOSIS — H02531 Eyelid retraction right upper eyelid: Secondary | ICD-10-CM | POA: Diagnosis not present

## 2017-04-15 DIAGNOSIS — H02101 Unspecified ectropion of right upper eyelid: Secondary | ICD-10-CM | POA: Diagnosis not present

## 2017-04-24 DIAGNOSIS — Z85528 Personal history of other malignant neoplasm of kidney: Secondary | ICD-10-CM | POA: Diagnosis not present

## 2017-04-24 DIAGNOSIS — R351 Nocturia: Secondary | ICD-10-CM | POA: Diagnosis not present

## 2017-04-24 DIAGNOSIS — Z8546 Personal history of malignant neoplasm of prostate: Secondary | ICD-10-CM | POA: Diagnosis not present

## 2017-05-13 ENCOUNTER — Ambulatory Visit (HOSPITAL_COMMUNITY)
Admission: RE | Admit: 2017-05-13 | Discharge: 2017-05-13 | Disposition: A | Discharge status: Home / Self Care | Payer: Medicare HMO | Source: Ambulatory Visit | Attending: Oncology | Admitting: Oncology

## 2017-05-13 DIAGNOSIS — I712 Thoracic aortic aneurysm, without rupture: Secondary | ICD-10-CM | POA: Diagnosis not present

## 2017-05-13 DIAGNOSIS — K449 Diaphragmatic hernia without obstruction or gangrene: Secondary | ICD-10-CM | POA: Insufficient documentation

## 2017-05-13 DIAGNOSIS — I700 Atherosclerosis of aorta: Secondary | ICD-10-CM | POA: Insufficient documentation

## 2017-05-13 DIAGNOSIS — R918 Other nonspecific abnormal finding of lung field: Secondary | ICD-10-CM | POA: Insufficient documentation

## 2017-05-13 DIAGNOSIS — C649 Malignant neoplasm of unspecified kidney, except renal pelvis: Secondary | ICD-10-CM | POA: Diagnosis not present

## 2017-05-13 DIAGNOSIS — I2510 Atherosclerotic heart disease of native coronary artery without angina pectoris: Secondary | ICD-10-CM | POA: Diagnosis not present

## 2017-05-20 ENCOUNTER — Inpatient Hospital Stay: Payer: Medicare HMO | Attending: Oncology | Admitting: Oncology

## 2017-05-20 VITALS — BP 129/82 | HR 94 | Temp 98.0°F | Resp 20 | Ht 74.0 in | Wt 233.1 lb

## 2017-05-20 DIAGNOSIS — Z8553 Personal history of malignant neoplasm of renal pelvis: Secondary | ICD-10-CM | POA: Diagnosis not present

## 2017-05-20 DIAGNOSIS — R918 Other nonspecific abnormal finding of lung field: Secondary | ICD-10-CM | POA: Diagnosis not present

## 2017-05-20 DIAGNOSIS — C61 Malignant neoplasm of prostate: Secondary | ICD-10-CM | POA: Insufficient documentation

## 2017-05-20 NOTE — Progress Notes (Addendum)
Hematology and Oncology Follow Up Visit  Eric Crosby [id number] [birth year] 72 y.o. 05/20/2017 11:17 AM Orpah Melter, MDMeyers, Annie Main, MD   Principle Diagnosis: 72 year old gentleman with lung mass diagnosed in September 2018.  He was found to have 7 mm noncalcified nodule noted in the right upper lobe of the lung with an adjacent smaller nodule. This was discovered on a CT scan obtained on 01/20/2017.   He has also history of left renal mass that was ablated in 2018.  Current therapy: Observation and surveillance.  Interim History: Mr. Eric Crosby presents today for a follow-up visit.  Since the last visit, he had a operation done on his eyelids and is recovering well at this time.  He denies any recent respiratory complaints including cough, wheezing or hemoptysis.  He does not report any recent hospitalizations or illnesses.  He denies any flank pain or hematuria.  He does not report any headaches, blurry vision, syncope or seizures. He does not report any fevers, chills or sweats. He does not report any cough, wheezing or hemoptysis. He does not report any nausea, vomiting or abdominal pain. He does not report any chest pain, palpitation or leg edema. He does not report any frequency urgency or hesitancy. He does not report any skeletal complaints. Remaining review of systems is negative.   Medications: I have reviewed the patient's current medications.  Current Outpatient Medications  Medication Sig Dispense Refill  . albuterol (PROVENTIL HFA;VENTOLIN HFA) 108 (90 BASE) MCG/ACT inhaler Inhale 2 puffs into the lungs every 4 (four) hours as needed for wheezing or shortness of breath.     . ALPRAZolam (XANAX) 0.25 MG tablet Take 0.125 mg by mouth at bedtime as needed for anxiety.     . ARTIFICIAL TEARS 0.1-0.3 % SOLN Place 1 drop into both eyes as needed for dry eyes.    . calcium carbonate (TUMS - DOSED IN MG ELEMENTAL CALCIUM) 500 MG chewable tablet Chew 2 tablets by mouth 2 (two) times  daily as needed for indigestion or heartburn.    . Carboxymethylcell-Hypromellose (GENTEAL OP) Apply 1 application to eye as needed (dry eyes).    . Ibuprofen (ADVIL) 200 MG CAPS Take 400-600 mg by mouth See admin instructions. Pt takes three tablets in the morning (600 mg) and two tablets at night (400mg )    . levothyroxine (SYNTHROID, LEVOTHROID) 137 MCG tablet Take 137 mcg by mouth daily before breakfast.    . meclizine (ANTIVERT) 25 MG tablet Take 25 mg by mouth daily as needed for dizziness.     . Multiple Vitamins-Minerals (ALIVE MENS ENERGY PO) Take 1 tablet by mouth daily.    . pantoprazole (PROTONIX) 40 MG tablet Take 40 mg by mouth every evening.     . St Johns Wort 300 MG CAPS Take 300 mg by mouth daily.     No current facility-administered medications for this visit.      Allergies:  Allergies  Allergen Reactions  . Adhesive [Tape] Swelling and Dermatitis  . Sulfa Antibiotics Other (See Comments)    Childhood reaction  Unknown     Past Medical History, Surgical history, Social history, and Family History were reviewed and updated.   Physical Exam: Blood pressure 129/82, pulse 94, temperature 98 F (36.7 C), temperature source Oral, resp. rate 20, height 6\' 2"  (1.88 m), weight 233 lb 1.6 oz (105.7 kg), SpO2 96 %. ECOG: 1  General appearance: alert and cooperative Head: Normocephalic, without obvious abnormality Neck: no adenopathy Lymph nodes: Cervical, supraclavicular, and axillary  nodes normal. Heart:regular rate and rhythm, S1, S2 normal, no murmur, click, rub or gallop Lung:chest clear, no wheezing, rales, normal symmetric air entry. Abdomin: soft, non-tender, without masses or organomegaly EXT:no erythema, induration, or nodules   Lab Results: Lab Results  Component Value Date   WBC 11.2 (H) 06/07/2016   HGB 14.2 06/07/2016   HCT 42.3 06/07/2016   MCV 92.4 06/07/2016   PLT 235 06/07/2016     Chemistry      Component Value Date/Time   NA 140  06/07/2016 0414   K 3.7 06/07/2016 0414   CL 109 06/07/2016 0414   CO2 27 06/07/2016 0414   BUN 19 08/29/2016 1028   CREATININE 1.04 08/29/2016 1028   CREATININE 1.04 08/29/2016 1028      Component Value Date/Time   CALCIUM 8.9 06/07/2016 0414   ALKPHOS 89 01/20/2014 1000   AST 28 01/20/2014 1000   ALT 27 01/20/2014 1000   BILITOT 0.6 01/20/2014 1000       Radiological Studies:  EXAM: CT CHEST WITHOUT CONTRAST  TECHNIQUE: Multidetector CT imaging of the chest was performed following the standard protocol without IV contrast.  COMPARISON:  01/20/2017 chest CT.  FINDINGS: Cardiovascular: Normal heart size. No significant pericardial fluid/thickening. Left anterior descending and left circumflex coronary atherosclerosis. Stable 4.7 cm ascending thoracic aortic aneurysm. Atherosclerotic thoracic aorta. Normal caliber pulmonary arteries.  Mediastinum/Nodes: No discrete thyroid nodules. Unremarkable esophagus. No pathologically enlarged axillary, mediastinal or gross hilar lymph nodes, noting limited sensitivity for the detection of hilar adenopathy on this noncontrast study.  Lungs/Pleura: No pneumothorax. No pleural effusion. No acute consolidative airspace disease or lung masses. Previously described apical right upper lobe dominant 7 mm solid pulmonary nodule is decreased to 3 mm (series 7/ image 27). There is patchy tree-in-bud opacity and scattered centrilobular micronodularity in both lungs, upper lung predominant. There is associated mild cylindrical and varicoid bronchiectasis predominantly in the mid to upper lungs. These findings are not appreciably changed. No new significant pulmonary nodules. Stable subcentimeter calcified anterior left upper lobe granuloma.  Upper abdomen: Small hiatal hernia.  Musculoskeletal: No aggressive appearing focal osseous lesions. Partially visualized surgical hardware from ACDF in the lower cervical spine. Mild  thoracic spondylosis.  IMPRESSION: 1. No findings suspicious for metastatic disease in the chest. No thoracic adenopathy. 2. 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 Previously described dominant apical right upper lobe pulmonary nodule is decreased and compatible with this infectious/inflammatory process. 3. Stable 4.7 cm ascending thoracic aortic aneurysm. Ascending thoracic aortic aneurysm. Recommend semi-annual imaging followup by CTA or MRA and referral to cardiothoracic surgery if not already obtained. This recommendation follows 2010 ACCF/AHA/AATS/ACR/ASA/SCA/SCAI/SIR/STS/SVM Guidelines for the Diagnosis and Management of Patients With Thoracic Aortic Disease. Circulation. 2010; 121: S505-L976. 4. Two-vessel coronary atherosclerosis. 5. Small hiatal hernia .   Impression and Plan:  72 year old gentleman with the following issues:  1. 7 mm noncalcified nodule noted in the right upper lobe of the lung with an adjacent smaller nodule. This was discovered on a CT scan obtained on 01/20/2017. No other abnormalities noted on this exam. CT scan of the abdomen and pelvis done in May 2018 showed no evidence of any malignancy.  CT scan obtained on anyway second 2019 was personally reviewed and discussed with the patient today.  This lung nodule appears to be infectious in etiology and does not  represent malignancy.  I have recommended no further follow-up from an oncology standpoint or repeat imaging studies.  2. Prostate cancer: Evidence of relapse at this time.  I do not believe his pulmonary findings are related due to prostate cancer metastasis.  3. Renal cell carcinoma: status post cryoablation done 2015 and in 2018. No evidence of recurrence at  this time based on imaging studies in May 2018.  He will continue to follow with interventional radiology periodically for repeat imaging studies.  I do not think his lung nodule is related to renal cancer metastasis.  All his concerns and questions were addressed today.  15 minutes were spent with the patient today face-to-face and more than 50% of that spent on counseling, education and coordination of care.       Zola Button, MD 1/9/201911:17 AM

## 2017-06-04 DIAGNOSIS — R69 Illness, unspecified: Secondary | ICD-10-CM | POA: Diagnosis not present

## 2017-07-16 DIAGNOSIS — E039 Hypothyroidism, unspecified: Secondary | ICD-10-CM | POA: Diagnosis not present

## 2017-08-03 DIAGNOSIS — Z961 Presence of intraocular lens: Secondary | ICD-10-CM | POA: Diagnosis not present

## 2017-08-03 DIAGNOSIS — Z9889 Other specified postprocedural states: Secondary | ICD-10-CM | POA: Diagnosis not present

## 2017-08-03 DIAGNOSIS — H2512 Age-related nuclear cataract, left eye: Secondary | ICD-10-CM | POA: Diagnosis not present

## 2017-08-03 DIAGNOSIS — L121 Cicatricial pemphigoid: Secondary | ICD-10-CM | POA: Diagnosis not present

## 2017-08-11 DIAGNOSIS — H524 Presbyopia: Secondary | ICD-10-CM | POA: Diagnosis not present

## 2017-09-04 DIAGNOSIS — J029 Acute pharyngitis, unspecified: Secondary | ICD-10-CM | POA: Diagnosis not present

## 2017-09-04 DIAGNOSIS — J069 Acute upper respiratory infection, unspecified: Secondary | ICD-10-CM | POA: Diagnosis not present

## 2017-09-04 DIAGNOSIS — J4530 Mild persistent asthma, uncomplicated: Secondary | ICD-10-CM | POA: Diagnosis not present

## 2017-09-09 DIAGNOSIS — Z803 Family history of malignant neoplasm of breast: Secondary | ICD-10-CM | POA: Diagnosis not present

## 2017-09-09 DIAGNOSIS — E039 Hypothyroidism, unspecified: Secondary | ICD-10-CM | POA: Diagnosis not present

## 2017-09-09 DIAGNOSIS — Z791 Long term (current) use of non-steroidal anti-inflammatories (NSAID): Secondary | ICD-10-CM | POA: Diagnosis not present

## 2017-09-09 DIAGNOSIS — R69 Illness, unspecified: Secondary | ICD-10-CM | POA: Diagnosis not present

## 2017-09-09 DIAGNOSIS — K219 Gastro-esophageal reflux disease without esophagitis: Secondary | ICD-10-CM | POA: Diagnosis not present

## 2017-09-09 DIAGNOSIS — Z809 Family history of malignant neoplasm, unspecified: Secondary | ICD-10-CM | POA: Diagnosis not present

## 2017-09-09 DIAGNOSIS — K5190 Ulcerative colitis, unspecified, without complications: Secondary | ICD-10-CM | POA: Diagnosis not present

## 2017-09-09 DIAGNOSIS — M1990 Unspecified osteoarthritis, unspecified site: Secondary | ICD-10-CM | POA: Diagnosis not present

## 2017-09-09 DIAGNOSIS — R42 Dizziness and giddiness: Secondary | ICD-10-CM | POA: Diagnosis not present

## 2017-09-09 DIAGNOSIS — Z792 Long term (current) use of antibiotics: Secondary | ICD-10-CM | POA: Diagnosis not present

## 2017-09-17 ENCOUNTER — Other Ambulatory Visit (HOSPITAL_COMMUNITY): Payer: Self-pay | Admitting: Interventional Radiology

## 2017-09-17 ENCOUNTER — Other Ambulatory Visit: Payer: Self-pay | Admitting: Radiology

## 2017-09-17 DIAGNOSIS — N2889 Other specified disorders of kidney and ureter: Secondary | ICD-10-CM

## 2017-10-08 ENCOUNTER — Encounter: Payer: Self-pay | Admitting: Radiology

## 2017-10-08 ENCOUNTER — Ambulatory Visit (HOSPITAL_COMMUNITY)
Admission: RE | Admit: 2017-10-08 | Discharge: 2017-10-08 | Disposition: A | Discharge status: Home / Self Care | Payer: Medicare HMO | Source: Ambulatory Visit | Attending: Interventional Radiology | Admitting: Interventional Radiology

## 2017-10-08 ENCOUNTER — Ambulatory Visit
Admission: RE | Admit: 2017-10-08 | Discharge: 2017-10-08 | Disposition: A | Discharge status: Home / Self Care | Payer: Medicare HMO | Source: Ambulatory Visit | Attending: Interventional Radiology | Admitting: Interventional Radiology

## 2017-10-08 DIAGNOSIS — K449 Diaphragmatic hernia without obstruction or gangrene: Secondary | ICD-10-CM | POA: Diagnosis not present

## 2017-10-08 DIAGNOSIS — N2889 Other specified disorders of kidney and ureter: Secondary | ICD-10-CM | POA: Insufficient documentation

## 2017-10-08 DIAGNOSIS — N281 Cyst of kidney, acquired: Secondary | ICD-10-CM | POA: Insufficient documentation

## 2017-10-08 HISTORY — PX: IR RADIOLOGIST EVAL & MGMT: IMG5224

## 2017-10-08 LAB — POCT I-STAT CREATININE: Creatinine, Ser: 1 mg/dL (ref 0.61–1.24)

## 2017-10-08 MED ORDER — IOPAMIDOL (ISOVUE-300) INJECTION 61%
100.0000 mL | Freq: Once | INTRAVENOUS | Status: AC | PRN
  Administered 2017-10-08: 100 mL via INTRAVENOUS

## 2017-10-08 MED ORDER — IOPAMIDOL (ISOVUE-300) INJECTION 61%
INTRAVENOUS | Status: AC
  Filled 2017-10-08: qty 100

## 2017-10-08 NOTE — Progress Notes (Addendum)
Chief Complaint: Status post cryoablation of a left renal mass on 06/06/2016 and prior cryoablation of a right renal mass on 01/27/2014.  History of Present Illness: Eric Crosby is a 72 y.o. male here for follow-up after a follow-up CT of the abdomen earlier today.  Since prior CT and visit on 09/09/2016, the patient had work-up for reactive airway disease demonstrating a 7 mm nodule of the right upper lung by CT on 01/20/2017.  He was referred to Dr. Alen Blew who repeated a CT on 05/13/2017 that demonstrated essential resolution of the nodule with minimal residual 3 mm nodularity and some mild centrilobular micro-nodularity present likely consistent with a chronic process.  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  He is asymptomatic with respect to prior bilateral renal ablation.  Renal function on 08/29/2016 was stable and normal.  Past Medical History:  Diagnosis Date  . Anxiety    hx of  . Arthritis    hx of  . Asthma    seasonal chronic  . Bipolar disorder ([place])    hx of  . Blood dyscrasia    POLYCYTHEMIA- NO MEDICATION OR TREATMENT NEEDED - PT TOLD TO MAKE EVERYONE AWARE HIS RBC CT HIGH - BUT NORMAL FOR HIM  . Complication of anesthesia   . Difficult intubation    Mask ventilation with an oral airway, grade III view with a MAC 4 and unable to pass bougie, grade II view with a glidescope. Recommend using glidescope for future intubations  . Diverticulosis    COLITIS  . History of kidney stones   . Hx of wheezing    OCCAS WHEEZING - POSS RELATED TO ALLERGIES OR ENVIRONMENTAL ISSUES-HAVE INHALER BUT HAS RARELY NEEDED  . Hypothyroidism   . 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 Prostate cancer ([place]) 2011   TX'D WITH SEED IMPLANT  . Right renal mass   . Sleep apnea    PT USED CPAP FOR SEVERAL YEARS BUT IT MADE  HIS EYE PROBLEM WORSE AND HE CAN NOT USE THE MACHINE NOW.    Past Surgical History:  Procedure Laterality Date  . colonoscopy    . EYE SURGERY     x 20  . eyelid surgery     17 SURGICAL PROCEDURES BLATERAL UPPER EYE LIDS  . HERNIA REPAIR     UMBILICAL  AND LOWER ABDOMINAL HERNIA REPAIR  . IR GENERIC HISTORICAL  03/25/2016   IR RADIOLOGIST EVAL & MGMT 03/25/2016 Aletta Edouard, MD [facility] INTERV RAD  . IR GENERIC HISTORICAL  07/11/2014   IR RADIOLOGIST EVAL & MGMT 07/11/2014 Aletta Edouard, MD [facility]  . IR GENERIC HISTORICAL  07/09/2016   IR RADIOLOGIST EVAL & MGMT 07/09/2016 Aletta Edouard, MD [facility]  . IR RADIOLOGIST EVAL & MGMT  09/09/2016  . IR RADIOLOGIST EVAL & MGMT  10/08/2017  . NECK SURGERY     CERVICAL FUSION - PT STATES SLIGHT LIMITED ROM  . RADIOLOGY WITH ANESTHESIA Left 06/06/2016   Procedure: left renal cryoablation;  Surgeon: Aletta Edouard, MD;  Location: [facility];  Service: Radiology;  Laterality: Left;  . RIGHT CATARACT EXTRACTION WITH LENS IMPLANT    . RIGHT RETINAL SURGERY FOR BUCKLE    . SEED IMPLANT FOR PROSTATE CANCER    . WISDOM TOOTH EXTRACTION      Allergies:  Adhesive [tape] and Sulfa antibiotics  Medications: Prior to Admission medications   Medication Sig Start Date End Date Taking? Authorizing Provider  albuterol (PROVENTIL HFA;VENTOLIN HFA) 108 (90 BASE) MCG/ACT inhaler Inhale 2 puffs into the lungs every 4 (four) hours as needed for wheezing or shortness of breath.    Yes [provider]  ALPRAZolam (XANAX) 0.25 MG tablet Take 0.125 mg by mouth at bedtime as needed for anxiety.    Yes [provider]  ARTIFICIAL TEARS 0.1-0.3 % SOLN Place 1 drop into both eyes as needed for dry eyes.   Yes [provider]  calcium carbonate (TUMS - DOSED IN MG ELEMENTAL CALCIUM) 500 MG chewable tablet Chew 2 tablets by mouth 2 (two) times daily as needed for indigestion or heartburn.   Yes [provider]    Carboxymethylcell-Hypromellose (GENTEAL OP) Apply 1 application to eye as needed (dry eyes).   Yes [provider]  Ibuprofen (ADVIL) 200 MG CAPS Take 400-600 mg by mouth See admin instructions. Pt takes three tablets in the morning (600 mg) and two tablets at night (48m)   Yes [provider]  levothyroxine (SYNTHROID, LEVOTHROID) 137 MCG tablet Take 137 mcg by mouth daily before breakfast.   Yes [provider]  meclizine (ANTIVERT) 25 MG tablet Take 25 mg by mouth daily as needed for dizziness.    Yes [provider]  Multiple Vitamins-Minerals (ALIVE MENS ENERGY PO) Take 1 tablet by mouth daily.   Yes [provider]  pantoprazole (PROTONIX) 40 MG tablet Take 40 mg by mouth every evening.  07/29/16  Yes [provider]  [place] Wort 300 MG CAPS Take 300 mg by mouth daily.   Yes [provider]     No family history on file.  Social History   Socioeconomic History  . Marital status: Married    Spouse name: Not on file  . Number of children: Not on file  . Years of education: Not on file  . Highest education level: Not on file  Occupational History  . Not on file  Social Needs  . Financial resource strain: Not on file  . Food insecurity:    Worry: Not on file    Inability: Not on file  . Transportation needs:    Medical: Not on file    Non-medical: Not on file  Tobacco Use  . Smoking status: Former SResearch scientist (life sciences) . Smokeless tobacco: Never Used  . Tobacco comment: 26 years ago  Substance and Sexual Activity  . Alcohol use: No  . Drug use: No  . Sexual activity: Yes  Lifestyle  . Physical activity:    Days per week: Not on file    Minutes per session: Not on file  . Stress: Not on file  Relationships  . Social connections:    Talks on phone: Not on file    Gets together: Not on file    Attends religious service: Not on file    Active member of club or organization: Not on file    Attends meetings of clubs or  organizations: Not on file    Relationship status: Not on file  Other Topics Concern  . Not on file  Social History Narrative  . Not on file    ECOG Status: 0 - Asymptomatic  Review of Systems: A 12 point ROS discussed and pertinent positives are indicated in the HPI above.  All other systems are negative.  Review of Systems  Constitutional: Negative.  Respiratory: Negative.   Cardiovascular: Negative.   Gastrointestinal: Negative.   Genitourinary: Negative.   Musculoskeletal: Negative.   Neurological: Negative.     Vital Signs: BP (!) 143/77   Pulse 77   Temp 97.9 F (36.6 C) (Oral)   Resp 15   Ht _0  (1.854 m)   Wt 230 lb (104.3 kg)   SpO2 96%   BMI 30.34 kg/m   Physical Exam  Constitutional: He is oriented to person, place, and time. He appears well-developed and well-nourished. No distress.  Abdominal: Soft. He exhibits no distension and no mass. There is no tenderness.  Musculoskeletal: He exhibits no edema.  Neurological: He is alert and oriented to person, place, and time.  Skin: Skin is warm and dry. He is not diaphoretic.  Vitals reviewed.    Imaging: Ir Radiologist Eval & Mgmt  Result Date: 10/08/2017 Please refer to notes tab for details about interventional procedure. (Op Note)   Labs:  CBC: No results for input(s): WBC, HGB, HCT, PLT in the last 8760 hours.  COAGS: No results for input(s): INR, APTT in the last 8760 hours.  BMP: Recent Labs    10/08/17 1003  CREATININE 1.00    LIVER FUNCTION TESTS: No results for input(s): BILITOT, AST, ALT, ALKPHOS, PROT, ALBUMIN in the last 8760 hours.  TUMOR MARKERS: No results for input(s): AFPTM, CEA, CA199, CHROMGRNA in the last 8760 hours.  Assessment and Plan:  I reviewed the CT performed earlier today with Eric Crosby.  It has yet to be finalized by a body imager.  The posterior left lower pole ablation defect shows diminishment in appearance of perinephric stranding and scarring area no  recurrent or residual enhancing neoplasm is identified.  Chronic ablation defect of the posterior mid to lower right kidney shows stable appearance with no evidence of enhancing tumor.  No new renal lesions are identified.  I recommended another follow-up CT in 1 year.  Eric Crosby canceled an appointment to see a thoracic surgeon for an opinion regarding the right upper lobe pulmonary nodule which has regressed.  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  He may also require echocardiography to evaluate the heart for evidence of any underlying valvular disease.  He will contact Dr. Olen Pel for a referral back to [facility].  Electronically SignedAletta Edouard T 10/08/2017, 1:16 PM     I spent a total of 15 Minutes in face to face in clinical consultation, greater than 50% of which was counseling/coordinating care post cryoablation of bilateral renal masses.

## 2017-10-27 DIAGNOSIS — M5416 Radiculopathy, lumbar region: Secondary | ICD-10-CM | POA: Diagnosis not present

## 2017-11-11 DIAGNOSIS — Z8546 Personal history of malignant neoplasm of prostate: Secondary | ICD-10-CM | POA: Diagnosis not present

## 2017-11-11 DIAGNOSIS — E039 Hypothyroidism, unspecified: Secondary | ICD-10-CM | POA: Diagnosis not present

## 2017-11-11 DIAGNOSIS — R911 Solitary pulmonary nodule: Secondary | ICD-10-CM | POA: Diagnosis not present

## 2017-11-11 DIAGNOSIS — Z0000 Encounter for general adult medical examination without abnormal findings: Secondary | ICD-10-CM | POA: Diagnosis not present

## 2017-11-11 DIAGNOSIS — Z23 Encounter for immunization: Secondary | ICD-10-CM | POA: Diagnosis not present

## 2017-11-11 DIAGNOSIS — K219 Gastro-esophageal reflux disease without esophagitis: Secondary | ICD-10-CM | POA: Diagnosis not present

## 2017-11-11 DIAGNOSIS — I712 Thoracic aortic aneurysm, without rupture: Secondary | ICD-10-CM | POA: Diagnosis not present

## 2017-11-11 DIAGNOSIS — R0789 Other chest pain: Secondary | ICD-10-CM | POA: Diagnosis not present

## 2018-02-10 DIAGNOSIS — Z961 Presence of intraocular lens: Secondary | ICD-10-CM | POA: Diagnosis not present

## 2018-02-10 DIAGNOSIS — H2512 Age-related nuclear cataract, left eye: Secondary | ICD-10-CM | POA: Diagnosis not present

## 2018-02-10 DIAGNOSIS — L121 Cicatricial pemphigoid: Secondary | ICD-10-CM | POA: Diagnosis not present

## 2018-02-10 DIAGNOSIS — Z9889 Other specified postprocedural states: Secondary | ICD-10-CM | POA: Diagnosis not present

## 2018-03-20 DIAGNOSIS — J209 Acute bronchitis, unspecified: Secondary | ICD-10-CM | POA: Diagnosis not present

## 2018-04-26 DIAGNOSIS — Z8546 Personal history of malignant neoplasm of prostate: Secondary | ICD-10-CM | POA: Diagnosis not present

## 2018-05-14 DIAGNOSIS — Z85528 Personal history of other malignant neoplasm of kidney: Secondary | ICD-10-CM | POA: Diagnosis not present

## 2018-05-14 DIAGNOSIS — Z8546 Personal history of malignant neoplasm of prostate: Secondary | ICD-10-CM | POA: Diagnosis not present

## 2018-06-15 DIAGNOSIS — K219 Gastro-esophageal reflux disease without esophagitis: Secondary | ICD-10-CM | POA: Diagnosis not present

## 2018-06-15 DIAGNOSIS — L121 Cicatricial pemphigoid: Secondary | ICD-10-CM | POA: Diagnosis not present

## 2018-06-15 DIAGNOSIS — G3184 Mild cognitive impairment, so stated: Secondary | ICD-10-CM | POA: Diagnosis not present

## 2018-06-15 DIAGNOSIS — R69 Illness, unspecified: Secondary | ICD-10-CM | POA: Diagnosis not present

## 2018-06-15 DIAGNOSIS — J45909 Unspecified asthma, uncomplicated: Secondary | ICD-10-CM | POA: Diagnosis not present

## 2018-06-15 DIAGNOSIS — D45 Polycythemia vera: Secondary | ICD-10-CM | POA: Diagnosis not present

## 2018-06-15 DIAGNOSIS — G8929 Other chronic pain: Secondary | ICD-10-CM | POA: Diagnosis not present

## 2018-06-15 DIAGNOSIS — G4730 Sleep apnea, unspecified: Secondary | ICD-10-CM | POA: Diagnosis not present

## 2018-06-15 DIAGNOSIS — E669 Obesity, unspecified: Secondary | ICD-10-CM | POA: Diagnosis not present

## 2018-06-15 DIAGNOSIS — E039 Hypothyroidism, unspecified: Secondary | ICD-10-CM | POA: Diagnosis not present

## 2018-06-16 DIAGNOSIS — R69 Illness, unspecified: Secondary | ICD-10-CM | POA: Diagnosis not present

## 2018-06-16 DIAGNOSIS — H9313 Tinnitus, bilateral: Secondary | ICD-10-CM | POA: Diagnosis not present

## 2018-06-16 DIAGNOSIS — E039 Hypothyroidism, unspecified: Secondary | ICD-10-CM | POA: Diagnosis not present

## 2018-07-19 DIAGNOSIS — M5416 Radiculopathy, lumbar region: Secondary | ICD-10-CM | POA: Diagnosis not present

## 2018-07-29 DIAGNOSIS — E039 Hypothyroidism, unspecified: Secondary | ICD-10-CM | POA: Diagnosis not present

## 2018-08-03 DIAGNOSIS — R69 Illness, unspecified: Secondary | ICD-10-CM | POA: Diagnosis not present

## 2018-09-14 DIAGNOSIS — M5416 Radiculopathy, lumbar region: Secondary | ICD-10-CM | POA: Diagnosis not present

## 2018-09-27 DIAGNOSIS — E039 Hypothyroidism, unspecified: Secondary | ICD-10-CM | POA: Diagnosis not present

## 2018-11-01 DIAGNOSIS — H5213 Myopia, bilateral: Secondary | ICD-10-CM | POA: Diagnosis not present

## 2018-11-01 DIAGNOSIS — H04123 Dry eye syndrome of bilateral lacrimal glands: Secondary | ICD-10-CM | POA: Diagnosis not present

## 2018-11-02 DIAGNOSIS — Z0100 Encounter for examination of eyes and vision without abnormal findings: Secondary | ICD-10-CM | POA: Diagnosis not present

## 2018-11-05 DIAGNOSIS — Z20828 Contact with and (suspected) exposure to other viral communicable diseases: Secondary | ICD-10-CM | POA: Diagnosis not present

## 2018-11-11 DIAGNOSIS — R69 Illness, unspecified: Secondary | ICD-10-CM | POA: Diagnosis not present

## 2018-11-15 DIAGNOSIS — Z0000 Encounter for general adult medical examination without abnormal findings: Secondary | ICD-10-CM | POA: Diagnosis not present

## 2018-11-15 DIAGNOSIS — E039 Hypothyroidism, unspecified: Secondary | ICD-10-CM | POA: Diagnosis not present

## 2018-11-18 ENCOUNTER — Other Ambulatory Visit: Payer: Self-pay | Admitting: Interventional Radiology

## 2018-11-18 DIAGNOSIS — N2889 Other specified disorders of kidney and ureter: Secondary | ICD-10-CM

## 2018-11-29 ENCOUNTER — Other Ambulatory Visit: Payer: Self-pay | Admitting: *Deleted

## 2018-11-29 DIAGNOSIS — N2889 Other specified disorders of kidney and ureter: Secondary | ICD-10-CM

## 2018-11-30 ENCOUNTER — Encounter (HOSPITAL_COMMUNITY): Payer: Self-pay

## 2018-11-30 ENCOUNTER — Ambulatory Visit (HOSPITAL_COMMUNITY)
Admission: RE | Admit: 2018-11-30 | Discharge: 2018-11-30 | Disposition: A | Discharge status: Home / Self Care | Payer: Medicare HMO | Source: Ambulatory Visit | Attending: Interventional Radiology | Admitting: Interventional Radiology

## 2018-11-30 ENCOUNTER — Other Ambulatory Visit: Payer: Self-pay

## 2018-11-30 DIAGNOSIS — N289 Disorder of kidney and ureter, unspecified: Secondary | ICD-10-CM | POA: Diagnosis not present

## 2018-11-30 DIAGNOSIS — K449 Diaphragmatic hernia without obstruction or gangrene: Secondary | ICD-10-CM | POA: Diagnosis not present

## 2018-11-30 DIAGNOSIS — Z85528 Personal history of other malignant neoplasm of kidney: Secondary | ICD-10-CM | POA: Diagnosis not present

## 2018-11-30 DIAGNOSIS — N200 Calculus of kidney: Secondary | ICD-10-CM | POA: Diagnosis not present

## 2018-11-30 DIAGNOSIS — N2889 Other specified disorders of kidney and ureter: Secondary | ICD-10-CM

## 2018-11-30 LAB — POCT I-STAT CREATININE: Creatinine, Ser: 1.1 mg/dL (ref 0.61–1.24)

## 2018-11-30 MED ORDER — IOHEXOL 300 MG/ML  SOLN
100.0000 mL | Freq: Once | INTRAMUSCULAR | Status: AC | PRN
  Administered 2018-11-30: 100 mL via INTRAVENOUS

## 2018-11-30 MED ORDER — SODIUM CHLORIDE (PF) 0.9 % IJ SOLN
INTRAMUSCULAR | Status: AC
  Filled 2018-11-30: qty 50

## 2018-12-02 ENCOUNTER — Ambulatory Visit
Admission: RE | Admit: 2018-12-02 | Discharge: 2018-12-02 | Disposition: A | Discharge status: Home / Self Care | Payer: Medicare HMO | Source: Ambulatory Visit | Attending: Interventional Radiology | Admitting: Interventional Radiology

## 2018-12-02 ENCOUNTER — Encounter: Payer: Self-pay | Admitting: *Deleted

## 2018-12-02 ENCOUNTER — Other Ambulatory Visit: Payer: Self-pay

## 2018-12-02 DIAGNOSIS — Z9889 Other specified postprocedural states: Secondary | ICD-10-CM | POA: Diagnosis not present

## 2018-12-02 DIAGNOSIS — N2889 Other specified disorders of kidney and ureter: Secondary | ICD-10-CM | POA: Diagnosis not present

## 2018-12-02 HISTORY — PX: IR RADIOLOGIST EVAL & MGMT: IMG5224

## 2018-12-02 NOTE — Progress Notes (Signed)
Chief Complaint: Status post cryoablation of a left renal mass on 06/06/2016 and prior cryoablation of a right renal mass on 01/27/2014.  History of Present Illness: Eric Crosby is a 73 y.o. male now 5 years post ablation of the right renal mass and 2-1/2 years post ablation of a left renal mass.  He has been doing well and is asymptomatic.  A follow-up CT of the abdomen was performed recently on 11/30/2018.  Past Medical History:  Diagnosis Date  . Anxiety    hx of  . Arthritis    hx of  . Asthma    seasonal chronic  . Bipolar disorder ([place])    hx of  . Blood dyscrasia    POLYCYTHEMIA- NO MEDICATION OR TREATMENT NEEDED - PT TOLD TO MAKE EVERYONE AWARE HIS RBC CT HIGH - BUT NORMAL FOR HIM  . Complication of anesthesia   . Difficult intubation    Mask ventilation with an oral airway, grade III view with a MAC 4 and unable to pass bougie, grade II view with a glidescope. Recommend using glidescope for future intubations  . Diverticulosis    COLITIS  . History of kidney stones   . Hx of wheezing    OCCAS WHEEZING - POSS RELATED TO ALLERGIES OR ENVIRONMENTAL ISSUES-HAVE INHALER BUT HAS RARELY NEEDED  . Hypothyroidism   . 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 Prostate cancer ([place]) 2011   TX'D WITH SEED IMPLANT  . Right renal mass   . Sleep apnea    PT USED CPAP FOR SEVERAL YEARS BUT IT MADE HIS EYE PROBLEM WORSE AND HE CAN NOT USE THE MACHINE NOW.    Past Surgical History:  Procedure Laterality Date  . colonoscopy    . EYE SURGERY     x 20  . eyelid surgery     17 SURGICAL PROCEDURES BLATERAL UPPER EYE LIDS  . HERNIA REPAIR     UMBILICAL  AND LOWER ABDOMINAL HERNIA REPAIR  . IR GENERIC HISTORICAL  03/25/2016   IR RADIOLOGIST EVAL & MGMT 03/25/2016 Aletta Edouard, MD [facility] INTERV RAD  . IR GENERIC HISTORICAL  07/11/2014   IR RADIOLOGIST EVAL & MGMT 07/11/2014 Aletta Edouard, MD [facility] INTER[facility] RAD  . IR GENERIC HISTORICAL  07/09/2016   IR RADIOLOGIST EVAL & MGMT 07/09/2016 Aletta Edouard, MD [facility] INTER[facility] RAD  . IR RADIOLOGIST EVAL & MGMT  09/09/2016  . IR RADIOLOGIST EVAL & MGMT  10/08/2017  . NECK SURGERY     CERVICAL FUSION - PT STATES SLIGHT LIMITED ROM  . RADIOLOGY WITH ANESTHESIA Left 06/06/2016   Procedure: left renal cryoablation;  Surgeon: Aletta Edouard, MD;  Location: [facility];  Service: Radiology;  Laterality: Left;  . RIGHT CATARACT EXTRACTION WITH LENS IMPLANT    . RIGHT RETINAL SURGERY FOR BUCKLE    . SEED IMPLANT FOR PROSTATE CANCER    . WISDOM TOOTH EXTRACTION      Allergies: Adhesive [tape] and Sulfa antibiotics  Medications: Prior to Admission medications   Medication Sig Start Date End Date Taking? Authorizing Provider  albuterol (PROVENTIL HFA;VENTOLIN HFA) 108 (90 BASE) MCG/ACT inhaler Inhale 2 puffs into the lungs every 4 (four) hours as needed for wheezing or shortness of breath.     [provider]  ALPRAZolam Duanne Moron) 0.25 MG tablet Take 0.125 mg by mouth at bedtime as needed for anxiety.  [provider]  ARTIFICIAL TEARS 0.1-0.3 % SOLN Place 1 drop into both eyes as needed for dry eyes.    [provider]  calcium carbonate (TUMS - DOSED IN MG ELEMENTAL CALCIUM) 500 MG chewable tablet Chew 2 tablets by mouth 2 (two) times daily as needed for indigestion or heartburn.    [provider]  Carboxymethylcell-Hypromellose (GENTEAL OP) Apply 1 application to eye as needed (dry eyes).    [provider]  Ibuprofen (ADVIL) 200 MG CAPS Take 400-600 mg by mouth See admin instructions. Pt takes three tablets in the morning (600 mg) and two tablets at night (46m)    [provider]  levothyroxine (SYNTHROID, LEVOTHROID) 137 MCG tablet Take 137 mcg by mouth daily before breakfast.    [provider]  meclizine (ANTIVERT) 25 MG tablet Take 25 mg by mouth daily as needed for  dizziness.     [provider]  Multiple Vitamins-Minerals (ALIVE MENS ENERGY PO) Take 1 tablet by mouth daily.    [provider]  pantoprazole (PROTONIX) 40 MG tablet Take 40 mg by mouth every evening.  07/29/16   [provider]  [facility]ort 300 MG CAPS Take 300 mg by mouth daily.    [provider]     No family history on file.  Social History   Socioeconomic History  . Marital status: Married    Spouse name: Not on file  . Number of children: Not on file  . Years of education: Not on file  . Highest education level: Not on file  Occupational History  . Not on file  Social Needs  . Financial resource strain: Not on file  . Food insecurity    Worry: Not on file    Inability: Not on file  . Transportation needs    Medical: Not on file    Non-medical: Not on file  Tobacco Use  . Smoking status: Former SResearch scientist (life sciences) . Smokeless tobacco: Never Used  . Tobacco comment: 26 years ago  Substance and Sexual Activity  . Alcohol use: No  . Drug use: No  . Sexual activity: Yes  Lifestyle  . Physical activity    Days per week: Not on file    Minutes per session: Not on file  . Stress: Not on file  Relationships  . Social [place] phone: Not on file    Gets together: Not on file    Attends religious service: Not on file    Active member of club or organization: Not on file    Attends meetings of clubs or organizations: Not on file    Relationship status: Not on file  Other Topics Concern  . Not on file  Social History Narrative  . Not on file    ECOG Status: 0 - Asymptomatic  Review of Systems  Constitutional: Negative.   Respiratory: Negative.   Cardiovascular: Negative.   Gastrointestinal: Negative.   Genitourinary: Negative.   Musculoskeletal: Negative.   Neurological: Negative.     Review of Systems: A 12 point ROS discussed and pertinent positives are indicated in the HPI above.  All other systems are negative.   Physical Exam No direct physical exam was performed (except for noted visual exam findings with Video Visits).   Vital Signs: There were no vitals taken for this visit.  Imaging: Ct Abdomen W Wo Contrast  Result Date: 11/30/2018 CLINICAL DATA:  Left renal cryoablation in 2018, right renal cryoablation in  2016. Surveillance imaging. EXAM: CT ABDOMEN WITHOUT AND WITH CONTRAST TECHNIQUE: Multidetector CT imaging of the abdomen was performed following the standard protocol before and following the bolus administration of intravenous contrast. CONTRAST:  161m OMNIPAQUE IOHEXOL 300 MG/ML  SOLN COMPARISON:  10/08/2017 FINDINGS: Lower chest: Small hiatal hernia. Hepatobiliary: Unremarkable Pancreas: Unremarkable Spleen: Unremarkable Adrenals/Urinary Tract: Adrenal glands unremarkable. The ablation site along the left kidney lower pole posteromedially is unchanged and without appreciable enhancement. A 0.7 by 0.7 cm slightly hyperdense lesion of the left kidney lower pole separate from the ablation site, technically too small to characterize although meriting surveillance. This lesion is not changed 09/09/2016, and was not visible on scans prior to that time. The ablation site along the right kidney lower pole appear stable with a small amount of calcification in the perinephric tissues but no observed enhancement. There are about 8 nonobstructive left renal calculi, the largest in the mid kidney measuring 0.5 cm in long axis on image 51/3. A left kidney upper pole cyst measuring 1.2 by 1.2 cm on image 52/11 appears simple. Five small hypodense lesions of the right kidney are technically too small to characterize although statistically likely to be cysts. Left peripelvic cysts are present. Stomach/Bowel: Unremarkable Vascular/Lymphatic: Aortoiliac atherosclerotic vascular disease. Other: No supplemental non-categorized findings. Musculoskeletal: Lumbar spondylosis and degenerative disc disease causing bilateral  foraminal impingement at L5-S1. IMPRESSION: 1. No findings of recurrence along the ablation sites. 2. There is a stable 7 mm complex lesion of the left kidney lower pole posteriorly, separate from the ablation site, which is technically too small to characterize although merit surveillance. 3. Nonobstructive left nephrolithiasis. 4. Hiatal hernia. 5.  Aortic Atherosclerosis (ICD10-I70.0). 6. Bilateral foraminal impingement at L5-S1. Electronically Signed   By: WVan ClinesM.D.   On: 11/30/2018 12:38    Labs:  CBC: No results for input(s): WBC, HGB, HCT, PLT in the last 8760 hours.  COAGS: No results for input(s): INR, APTT in the last 8760 hours.  BMP: Recent Labs    11/30/18 0847  CREATININE 1.10    Assessment and Plan:  I spoke with Mr. JNavisby phone.  The follow-up CT demonstrates stable small areas of scarring at the level of prior cryoablation of both lower pole posterior cortical masses with no evidence of tumor recurrence.  There is a stable tiny high density cortical lesion of the posterior lower pole of the left kidney measuring 7 mm which was present previously and on review in 2017 appeared to be a small cyst.  This may represent a small complex cyst and does not appear to enhance on the current study supporting a small hemorrhagic cyst.  This will be followed on subsequent studies.  Given the appearance of the ablation sites, I recommended that we wait 2 years for another follow-up CT.  Electronically Signed: GAzzie Roup7/23/2020, 11:47 AM     I spent a total of 15 Minutes in remote  clinical consultation, greater than 50% of which was counseling/coordinating care post cryoablation of bilateral renal masses.    Visit type: Audio only (telephone). Audio (no video) only due to inability to dScientist, physiological. Alternative for in-[facility] [place] [place] This visit type was conducted due to  national recommendations for restrictions regarding the COVID-19 Pandemic (e.g. social distancing).  This format is felt to be most appropriate for this patient at this time.  All issues noted in this document were discussed and addressed.

## 2018-12-29 DIAGNOSIS — Z1159 Encounter for screening for other viral diseases: Secondary | ICD-10-CM | POA: Diagnosis not present

## 2018-12-30 DIAGNOSIS — K219 Gastro-esophageal reflux disease without esophagitis: Secondary | ICD-10-CM | POA: Diagnosis not present

## 2019-01-18 DIAGNOSIS — Z1159 Encounter for screening for other viral diseases: Secondary | ICD-10-CM | POA: Diagnosis not present

## 2019-01-18 DIAGNOSIS — Z23 Encounter for immunization: Secondary | ICD-10-CM | POA: Diagnosis not present

## 2019-01-18 DIAGNOSIS — E039 Hypothyroidism, unspecified: Secondary | ICD-10-CM | POA: Diagnosis not present

## 2019-01-19 DIAGNOSIS — R69 Illness, unspecified: Secondary | ICD-10-CM | POA: Diagnosis not present

## 2019-03-25 DIAGNOSIS — M5416 Radiculopathy, lumbar region: Secondary | ICD-10-CM | POA: Diagnosis not present

## 2019-04-20 DIAGNOSIS — M5416 Radiculopathy, lumbar region: Secondary | ICD-10-CM | POA: Diagnosis not present

## 2019-05-17 DIAGNOSIS — M5416 Radiculopathy, lumbar region: Secondary | ICD-10-CM | POA: Diagnosis not present

## 2019-06-02 DIAGNOSIS — M5416 Radiculopathy, lumbar region: Secondary | ICD-10-CM | POA: Diagnosis not present

## 2019-06-16 DIAGNOSIS — M5412 Radiculopathy, cervical region: Secondary | ICD-10-CM | POA: Diagnosis not present

## 2019-06-16 DIAGNOSIS — M5416 Radiculopathy, lumbar region: Secondary | ICD-10-CM | POA: Diagnosis not present

## 2019-06-18 DIAGNOSIS — Z20828 Contact with and (suspected) exposure to other viral communicable diseases: Secondary | ICD-10-CM | POA: Diagnosis not present

## 2019-06-27 DIAGNOSIS — M5412 Radiculopathy, cervical region: Secondary | ICD-10-CM | POA: Diagnosis not present

## 2019-06-27 DIAGNOSIS — M5416 Radiculopathy, lumbar region: Secondary | ICD-10-CM | POA: Diagnosis not present

## 2019-07-07 DIAGNOSIS — M545 Low back pain: Secondary | ICD-10-CM | POA: Diagnosis not present

## 2019-07-07 DIAGNOSIS — M542 Cervicalgia: Secondary | ICD-10-CM | POA: Diagnosis not present

## 2019-07-11 DIAGNOSIS — M47812 Spondylosis without myelopathy or radiculopathy, cervical region: Secondary | ICD-10-CM | POA: Diagnosis not present

## 2019-07-11 DIAGNOSIS — M5416 Radiculopathy, lumbar region: Secondary | ICD-10-CM | POA: Diagnosis not present

## 2019-07-19 DIAGNOSIS — M5416 Radiculopathy, lumbar region: Secondary | ICD-10-CM | POA: Diagnosis not present

## 2019-07-25 DIAGNOSIS — R69 Illness, unspecified: Secondary | ICD-10-CM | POA: Diagnosis not present

## 2019-09-01 DIAGNOSIS — D229 Melanocytic nevi, unspecified: Secondary | ICD-10-CM | POA: Diagnosis not present

## 2019-09-21 DIAGNOSIS — D485 Neoplasm of uncertain behavior of skin: Secondary | ICD-10-CM | POA: Diagnosis not present

## 2019-09-21 DIAGNOSIS — L820 Inflamed seborrheic keratosis: Secondary | ICD-10-CM | POA: Diagnosis not present

## 2019-09-23 DIAGNOSIS — Z961 Presence of intraocular lens: Secondary | ICD-10-CM | POA: Diagnosis not present

## 2019-09-23 DIAGNOSIS — H5213 Myopia, bilateral: Secondary | ICD-10-CM | POA: Diagnosis not present

## 2019-09-23 DIAGNOSIS — H2512 Age-related nuclear cataract, left eye: Secondary | ICD-10-CM | POA: Diagnosis not present

## 2019-09-23 DIAGNOSIS — Z973 Presence of spectacles and contact lenses: Secondary | ICD-10-CM | POA: Diagnosis not present

## 2019-09-23 DIAGNOSIS — L121 Cicatricial pemphigoid: Secondary | ICD-10-CM | POA: Diagnosis not present

## 2019-09-23 DIAGNOSIS — H02054 Trichiasis without entropian left upper eyelid: Secondary | ICD-10-CM | POA: Diagnosis not present

## 2019-09-23 DIAGNOSIS — Z9889 Other specified postprocedural states: Secondary | ICD-10-CM | POA: Diagnosis not present

## 2019-09-23 DIAGNOSIS — H02051 Trichiasis without entropian right upper eyelid: Secondary | ICD-10-CM | POA: Diagnosis not present

## 2019-11-17 DIAGNOSIS — Z0000 Encounter for general adult medical examination without abnormal findings: Secondary | ICD-10-CM | POA: Diagnosis not present

## 2019-11-17 DIAGNOSIS — Z131 Encounter for screening for diabetes mellitus: Secondary | ICD-10-CM | POA: Diagnosis not present

## 2019-11-17 DIAGNOSIS — E039 Hypothyroidism, unspecified: Secondary | ICD-10-CM | POA: Diagnosis not present

## 2020-01-24 DIAGNOSIS — R69 Illness, unspecified: Secondary | ICD-10-CM | POA: Diagnosis not present

## 2020-02-21 DIAGNOSIS — Z0189 Encounter for other specified special examinations: Secondary | ICD-10-CM | POA: Diagnosis not present

## 2020-03-10 IMAGING — CT CT ABDOMEN WITHOUT AND WITH CONTRAST
3 of 16 series · 10 of 46 positions shown, 16 images · IV contrast (omnipaque)
Comparison: 10/08/2017

CLINICAL DATA: Left renal cryoablation in 7337, right renal
cryoablation in 8235. Surveillance imaging.

EXAM:
CT ABDOMEN WITHOUT AND WITH CONTRAST
TECHNIQUE: Multidetector CT imaging of the abdomen was performed following the
standard protocol before and following the bolus administration of
intravenous contrast.
CONTRAST:  100mL OMNIPAQUE IOHEXOL 300 MG/ML  SOLN

[Series 3: coronal pre · coronal · non-contrast · 0.63mm/px · 2 of 109 slices shown, 3 images]
[im 37/109  soft-tissue]
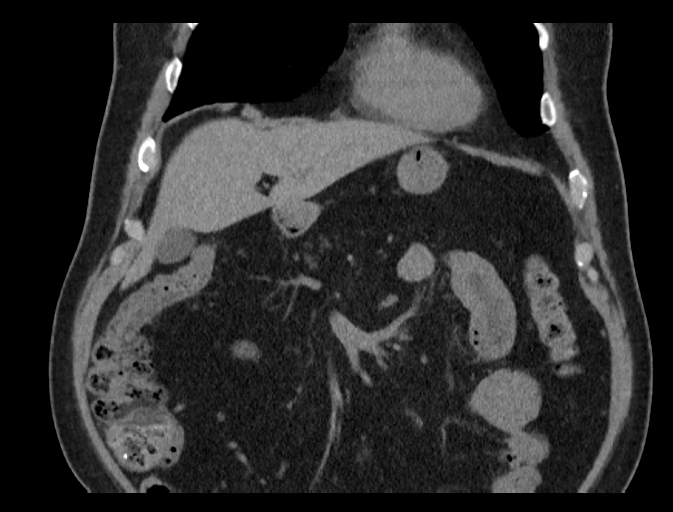
[im 37/109  bone]
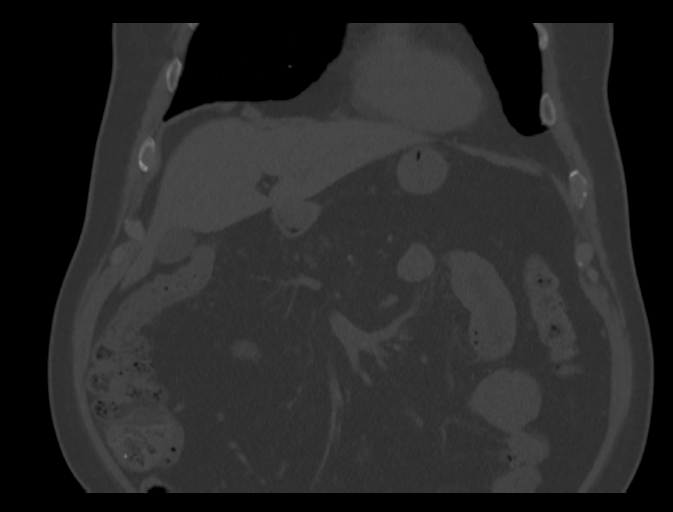
[im 73/109  soft-tissue]
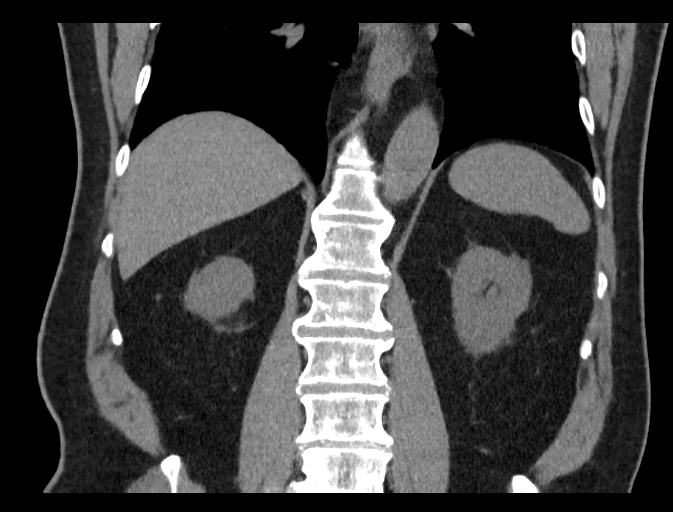

[Series 6: axial arterial · axial · arterial · 0.81mm/px · z∈[-290,-110]mm · 4 of 101 slices shown, 9 images]
[im 21/101  soft-tissue]
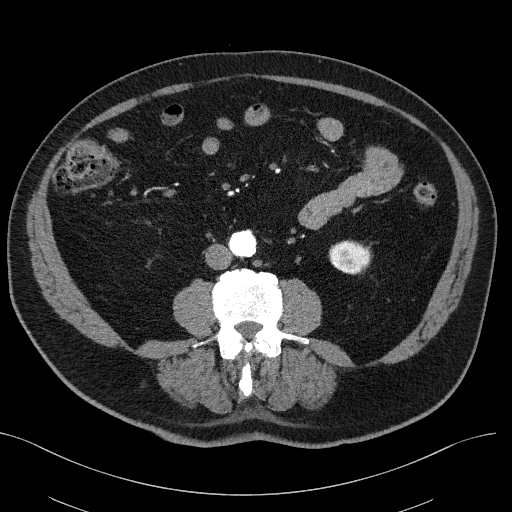
[im 21/101  lung]
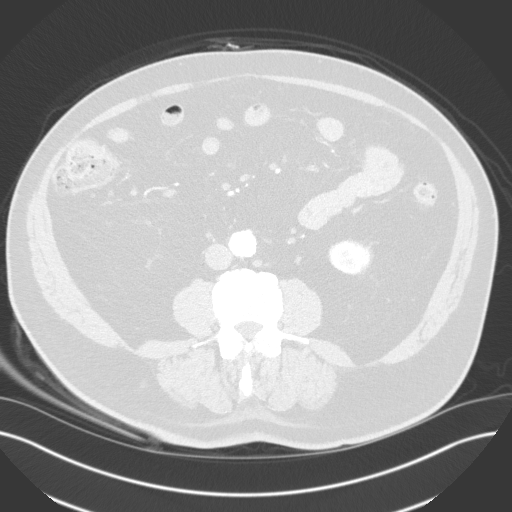
[im 21/101  bone]
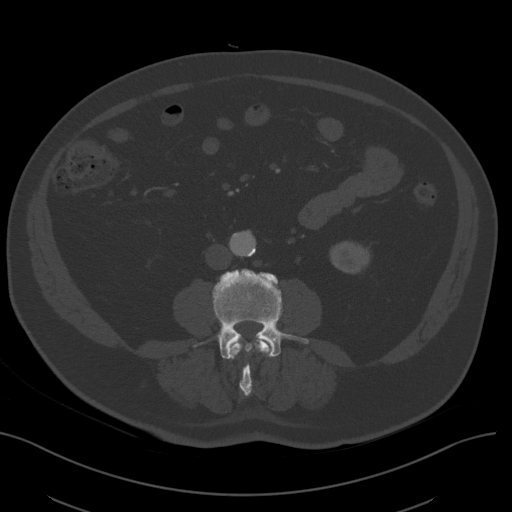
[im 41/101  soft-tissue]
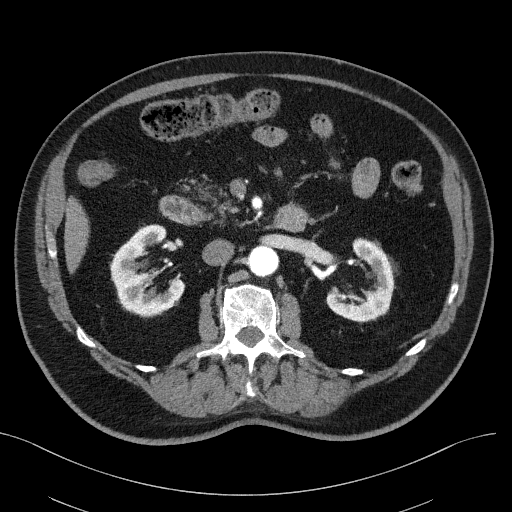
[im 41/101  lung]
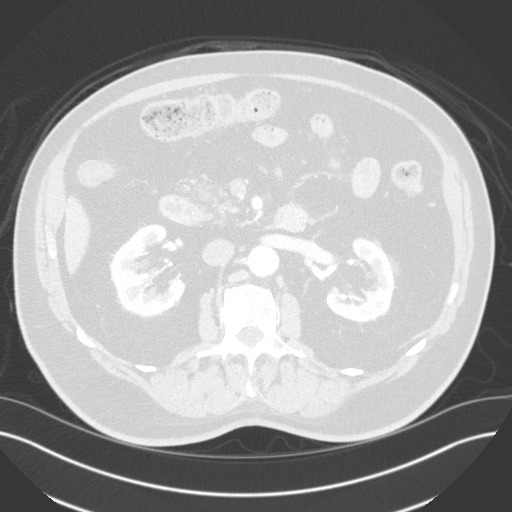
[im 61/101  soft-tissue]
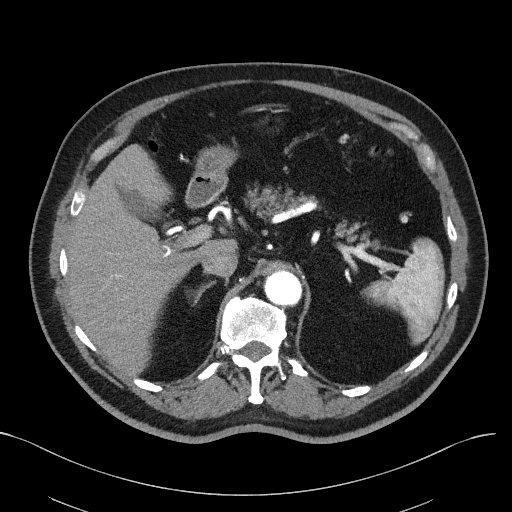
[im 61/101  lung]
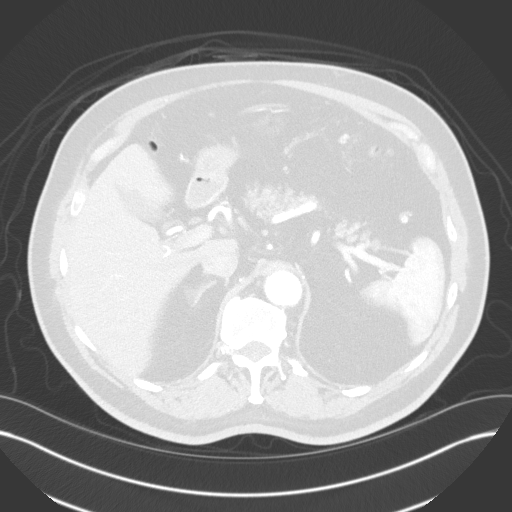
[im 81/101  soft-tissue]
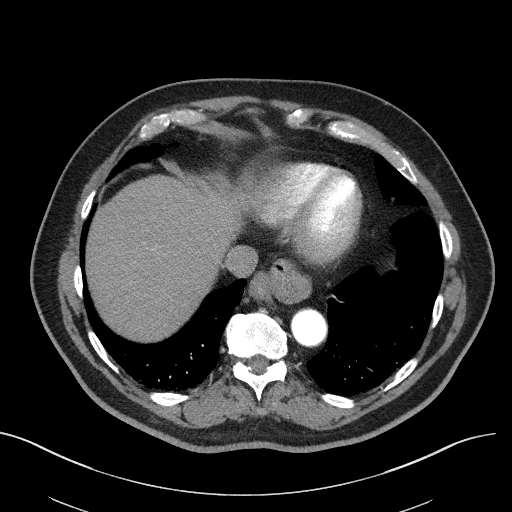
[im 81/101  lung]
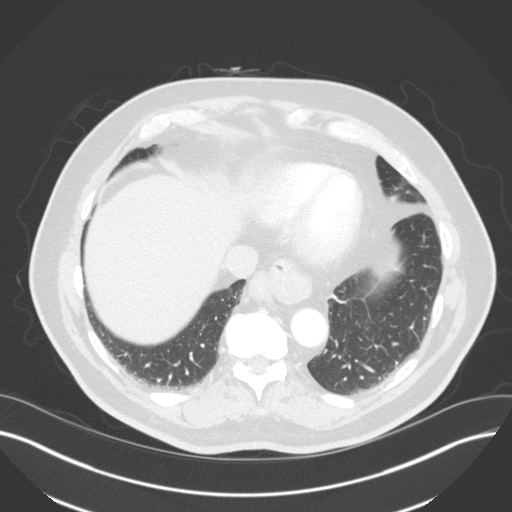

[Series 11: axial nephro · axial · 0.81mm/px · z∈[-290,-110]mm · 4 of 101 slices shown]
[im 21/101  soft-tissue]
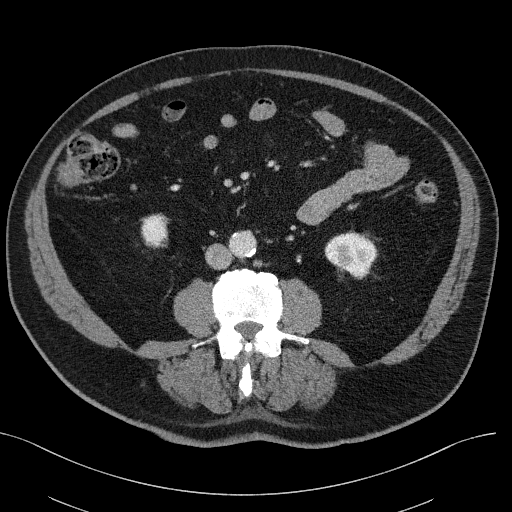
[im 41/101  soft-tissue]
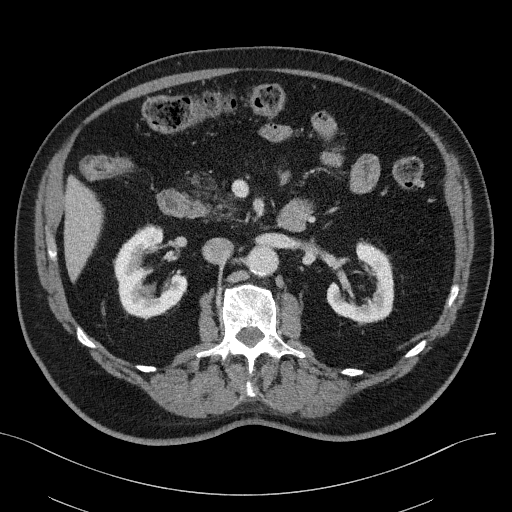
[im 61/101  soft-tissue]
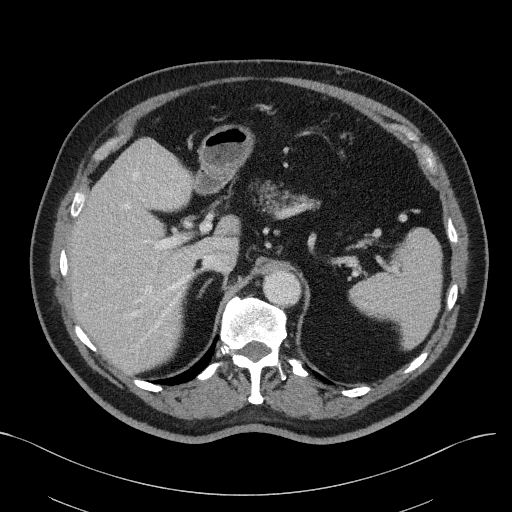
[im 81/101  soft-tissue]
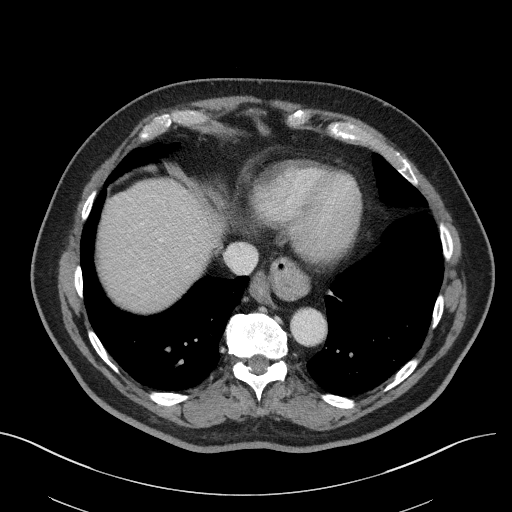

[10 of 46 positions shown; findings below may reference images not displayed]

FINDINGS: Lower chest: Small hiatal hernia.

Hepatobiliary: Unremarkable

Pancreas: Unremarkable

Spleen: Unremarkable

Adrenals/Urinary Tract: Adrenal glands unremarkable.

The ablation site along the left kidney lower pole posteromedially
is unchanged and without appreciable enhancement.

A 0.7 by 0.7 cm slightly hyperdense lesion of the left kidney lower
pole separate from the ablation site, technically too small to
characterize although meriting surveillance. This lesion is not
changed 09/09/2016, and was not visible on scans prior to that time.

The ablation site along the right kidney lower pole appear stable
with a small amount of calcification in the perinephric tissues but
no observed enhancement.

There are about 8 nonobstructive left renal calculi, the largest in
the mid kidney measuring 0.5 cm in long axis on image 51/3.

A left kidney upper pole cyst measuring 1.2 by 1.2 cm on image 52/11
appears simple.

Five small hypodense lesions of the right kidney are technically too
small to characterize although statistically likely to be cysts.
Left peripelvic cysts are present.

Stomach/Bowel: Unremarkable

Vascular/Lymphatic: Aortoiliac atherosclerotic vascular disease.

Other: No supplemental non-categorized findings.

Musculoskeletal: Lumbar spondylosis and degenerative disc disease
causing bilateral foraminal impingement at L5-S1.
IMPRESSION: 1. No findings of recurrence along the ablation sites.
2. There is a stable 7 mm complex lesion of the left kidney lower
pole posteriorly, separate from the ablation site, which is
technically too small to characterize although merit surveillance.
3. Nonobstructive left nephrolithiasis.
4. Hiatal hernia.
5.  Aortic Atherosclerosis ([id number]).
6. Bilateral foraminal impingement at L5-S1.

## 2020-04-16 DIAGNOSIS — L720 Epidermal cyst: Secondary | ICD-10-CM | POA: Diagnosis not present

## 2020-04-26 DIAGNOSIS — L821 Other seborrheic keratosis: Secondary | ICD-10-CM | POA: Diagnosis not present

## 2020-04-26 DIAGNOSIS — L720 Epidermal cyst: Secondary | ICD-10-CM | POA: Diagnosis not present

## 2020-04-26 DIAGNOSIS — D225 Melanocytic nevi of trunk: Secondary | ICD-10-CM | POA: Diagnosis not present

## 2020-06-10 ENCOUNTER — Other Ambulatory Visit: Payer: Self-pay

## 2020-06-10 ENCOUNTER — Emergency Department (HOSPITAL_COMMUNITY)
Admission: EM | Admit: 2020-06-10 | Discharge: 2020-06-11 | Disposition: A | Discharge status: Home / Self Care | Payer: Medicare Other | Attending: Emergency Medicine | Admitting: Emergency Medicine

## 2020-06-10 DIAGNOSIS — Z8546 Personal history of malignant neoplasm of prostate: Secondary | ICD-10-CM | POA: Insufficient documentation

## 2020-06-10 DIAGNOSIS — N200 Calculus of kidney: Secondary | ICD-10-CM | POA: Insufficient documentation

## 2020-06-10 DIAGNOSIS — J45909 Unspecified asthma, uncomplicated: Secondary | ICD-10-CM | POA: Insufficient documentation

## 2020-06-10 DIAGNOSIS — E039 Hypothyroidism, unspecified: Secondary | ICD-10-CM | POA: Insufficient documentation

## 2020-06-10 DIAGNOSIS — Z87891 Personal history of nicotine dependence: Secondary | ICD-10-CM | POA: Diagnosis not present

## 2020-06-10 DIAGNOSIS — R109 Unspecified abdominal pain: Secondary | ICD-10-CM | POA: Diagnosis not present

## 2020-06-10 LAB — CBC
HCT: 52.3 % — ABNORMAL HIGH (ref 39.0–52.0)
Hemoglobin: 17.2 g/dL — ABNORMAL HIGH (ref 13.0–17.0)
MCH: 31.7 pg (ref 26.0–34.0)
MCHC: 32.9 g/dL (ref 30.0–36.0)
MCV: 96.3 fL (ref 80.0–100.0)
Platelets: 289 10*3/uL (ref 150–400)
RBC: 5.43 MIL/uL (ref 4.22–5.81)
RDW: 13.9 % (ref 11.5–15.5)
WBC: 14.2 10*3/uL — ABNORMAL HIGH (ref 4.0–10.5)
nRBC: 0 % (ref 0.0–0.2)

## 2020-06-10 MED ORDER — ONDANSETRON 4 MG PO TBDP
4.0000 mg | ORAL_TABLET | Freq: Once | ORAL | Status: AC
  Administered 2020-06-10: 4 mg via ORAL

## 2020-06-10 MED ORDER — OXYCODONE-ACETAMINOPHEN 5-325 MG PO TABS
1.0000 | ORAL_TABLET | ORAL | Status: DC | PRN
  Administered 2020-06-10: 1 via ORAL
  Filled 2020-06-10: qty 1

## 2020-06-10 NOTE — ED Triage Notes (Addendum)
Pt presents to ED POV. Pt c/o L flank pain. Pt reports that pain began suddenly at 2030. Pt denies any GI/GU s/s. Hx colitis and kidney stones

## 2020-06-11 ENCOUNTER — Emergency Department (HOSPITAL_COMMUNITY): Payer: Medicare Other

## 2020-06-11 LAB — BASIC METABOLIC PANEL
Anion gap: 10 (ref 5–15)
BUN: 13 mg/dL (ref 8–23)
CO2: 24 mmol/L (ref 22–32)
Calcium: 9.7 mg/dL (ref 8.9–10.3)
Chloride: 107 mmol/L (ref 98–111)
Creatinine, Ser: 1.07 mg/dL (ref 0.61–1.24)
GFR, Estimated: >60 mL/min (ref >60)
Glucose, Bld: 137 mg/dL — ABNORMAL HIGH (ref 70–99)
Potassium: 4.4 mmol/L (ref 3.5–5.1)
Sodium: 141 mmol/L (ref 135–145)

## 2020-06-11 LAB — URINALYSIS, ROUTINE W REFLEX MICROSCOPIC
Bacteria, UA: NONE SEEN
Bilirubin Urine: NEGATIVE
Glucose, UA: NEGATIVE mg/dL
Hgb urine dipstick: NEGATIVE
Ketones, ur: NEGATIVE mg/dL
Leukocytes,Ua: NEGATIVE
Nitrite: NEGATIVE
Protein, ur: 30 mg/dL — AB
Specific Gravity, Urine: 1.024 (ref 1.005–1.030)
pH: 5 (ref 5.0–8.0)

## 2020-06-11 LAB — LIPASE, BLOOD: Lipase: 22 U/L (ref 11–51)

## 2020-06-11 MED ORDER — OXYCODONE HCL 5 MG PO TABS: 5.0000 mg | ORAL_TABLET | ORAL | 0 refills | Status: AC | PRN

## 2020-06-11 MED ORDER — TAMSULOSIN HCL 0.4 MG PO CAPS: 0.4000 mg | ORAL_CAPSULE | Freq: Every day | ORAL | 0 refills | Status: AC

## 2020-06-11 NOTE — Discharge Instructions (Addendum)
You were evaluated in the Emergency Department and after careful evaluation, we did not find any emergent condition requiring admission or further testing in the hospital.  Your exam/testing today was overall reassuring.  Your symptoms seem to be due to a kidney stone.  Please use Tylenol or Motrin at home for discomfort.  For more significant pain you can use the oxycodone medication provided.  Use the Flomax medication as directed to help you pass the kidney stone.  We recommend follow-up with a urologist.  Your CT also demonstrated a small increase in the size of your left renal mass.  Recommend follow-up imaging which can be scheduled by her primary care doctor.  Please return to the Emergency Department if you experience any worsening of your condition.  Thank you for allowing [place] to be a part of your care.

## 2020-06-11 NOTE — ED Notes (Signed)
Patient verbalizes understanding of discharge instructions. Prescriptions reviewed. Pt provided with urine strainer. Opportunity for questioning and answers were provided. Armband removed by staff, pt discharged from ED ambulatory.

## 2020-06-11 NOTE — ED Provider Notes (Signed)
[facility] Emergency Department Provider Note MRN:  [id number]  Arrival date & time: 06/11/20     Chief Complaint   Flank Pain   History of Present Illness   Eric Crosby is a 75 y.o. year-old male with a history of kidney stone, prostate cancer, renal mass presenting to the ED with chief complaint of flank pain.  Sudden onset left flank pain with radiation to the left lower quadrant, onset 8 PM.  Pain has been constant since that time, at times it was severe.  The oxycodone he received in the waiting room has eased off the pain.  Denies nausea vomiting or diarrhea, no chest pain or shortness of breath, no fever, no hematuria, no dysuria.  Review of Systems  A complete 10 system review of systems was obtained and all systems are negative except as noted in the HPI and PMH.   Patient's Health History    Past Medical History:  Diagnosis Date  . Anxiety    hx of  . Arthritis    hx of  . Asthma    seasonal chronic  . Bipolar disorder ([place])    hx of  . Blood dyscrasia    POLYCYTHEMIA- NO MEDICATION OR TREATMENT NEEDED - PT TOLD TO MAKE EVERYONE AWARE HIS RBC CT HIGH - BUT NORMAL FOR HIM  . Complication of anesthesia   . Difficult intubation    Mask ventilation with an oral airway, grade III view with a MAC 4 and unable to pass bougie, grade II view with a glidescope. Recommend using glidescope for future intubations  . Diverticulosis    COLITIS  . History of kidney stones   . Hx of wheezing    OCCAS WHEEZING - POSS RELATED TO ALLERGIES OR ENVIRONMENTAL ISSUES-HAVE INHALER BUT HAS RARELY NEEDED  . Hypothyroidism   . 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 Prostate cancer ([place]) 2011   TX'D WITH SEED IMPLANT  . Right renal mass   . Sleep apnea    PT USED CPAP FOR SEVERAL YEARS BUT IT MADE HIS EYE PROBLEM WORSE AND HE CAN NOT USE THE MACHINE NOW.    Past  Surgical History:  Procedure Laterality Date  . colonoscopy    . EYE SURGERY     x 20  . eyelid surgery     17 SURGICAL PROCEDURES BLATERAL UPPER EYE LIDS  . HERNIA REPAIR     UMBILICAL  AND LOWER ABDOMINAL HERNIA REPAIR  . IR GENERIC HISTORICAL  03/25/2016   IR RADIOLOGIST EVAL & MGMT 03/25/2016 Aletta Edouard, MD [facility] INTERV RAD  . IR GENERIC HISTORICAL  07/11/2014   IR RADIOLOGIST EVAL & MGMT 07/11/2014 Aletta Edouard, MD [facility] INTERV RAD  . IR GENERIC HISTORICAL  07/09/2016   IR RADIOLOGIST EVAL & MGMT 07/09/2016 Aletta Edouard, MD [facility] INTERV RAD  . IR RADIOLOGIST EVAL & MGMT  09/09/2016  . IR RADIOLOGIST EVAL & MGMT  10/08/2017  . IR RADIOLOGIST EVAL & MGMT  12/02/2018  . NECK SURGERY     CERVICAL FUSION - PT STATES SLIGHT LIMITED ROM  . RADIOLOGY WITH ANESTHESIA Left 06/06/2016   Procedure: left renal cryoablation;  Surgeon: Aletta Edouard, MD;  Location: [facility];  Service: Radiology;  Laterality: Left;  . RIGHT CATARACT EXTRACTION WITH LENS IMPLANT    . RIGHT RETINAL SURGERY FOR BUCKLE    . SEED IMPLANT FOR  PROSTATE CANCER    . WISDOM TOOTH EXTRACTION      No family history on file.  Social History   Socioeconomic History  . Marital status: Married    Spouse name: Not on file  . Number of children: Not on file  . Years of education: Not on file  . Highest education level: Not on file  Occupational History  . Not on file  Tobacco Use  . Smoking status: Former Research scientist (life sciences)  . Smokeless tobacco: Never Used  . Tobacco comment: 26 years ago  Substance and Sexual Activity  . Alcohol use: No  . Drug use: No  . Sexual activity: Yes  Other Topics Concern  . Not on file  Social History Narrative  . Not on file   Social Determinants of Health   Financial Resource Strain: Not on file  Food Insecurity: Not on file  Transportation Needs: Not on file  Physical Activity: Not on file  Stress: Not on file  Social Connections: Not on file  Intimate Partner Violence: Not on file      Physical Exam   Vitals:   06/10/20 2248 06/11/20 0148  BP: (!) 162/97 (!) 132/97  Pulse: 78 92  Resp: 20 18  Temp: 98.1 F (36.7 C) 98.4 F (36.9 C)  SpO2: 99% 98%    CONSTITUTIONAL: Well-appearing, NAD NEURO:  Alert and oriented x 3, no focal deficits EYES:  eyes equal and reactive ENT/NECK:  no LAD, no JVD CARDIO: Regular rate, well-perfused, normal S1 and S2 PULM:  CTAB no wheezing or rhonchi GI/GU:  normal bowel sounds, non-distended, non-tender MSK/SPINE:  No gross deformities, no edema SKIN:  no rash, atraumatic PSYCH:  Appropriate speech and behavior  *Additional and/or pertinent findings included in MDM below  Diagnostic and Interventional Summary    EKG Interpretation  Date/Time:    Ventricular Rate:    PR Interval:    QRS Duration:   QT Interval:    QTC Calculation:   R Axis:     Text Interpretation:        Labs Reviewed  URINALYSIS, ROUTINE W REFLEX MICROSCOPIC - Abnormal; Notable for the following components:      Result Value   APPearance HAZY (*)    Protein, ur 30 (*)    All other components within normal limits  CBC - Abnormal; Notable for the following components:   WBC 14.2 (*)    Hemoglobin 17.2 (*)    HCT 52.3 (*)    All other components within normal limits  BASIC METABOLIC PANEL - Abnormal; Notable for the following components:   Glucose, Bld 137 (*)    All other components within normal limits  LIPASE, BLOOD    CT RENAL STONE STUDY  Final Result      Medications  oxyCODONE-acetaminophen (PERCOCET/ROXICET) 5-325 MG per tablet 1 tablet (1 tablet Oral Given 06/10/20 2304)  ondansetron (ZOFRAN-ODT) disintegrating tablet 4 mg (4 mg Oral Given 06/10/20 2304)     Procedures  /  Critical Care Procedures  ED Course and Medical Decision Making  I have reviewed the triage vital signs, the nursing notes, and pertinent available records from the EMR.  Listed above are laboratory and imaging tests that I personally ordered, reviewed,  and interpreted and then considered in my medical decision making (see below for details).  Suspect kidney stone, but given patient's history of renal mass, prostate cancer, colitis and the fact that patient does not have large blood in the urine, will CT to  further evaluate.     CT confirms kidney stone.  Urinalysis is without infection, no AKI, pain well controlled.  Appropriate for discharge.  Barth Kirks. Sedonia Small, [place] [email or web address]  Final Clinical Impressions(s) / ED Diagnoses     ICD-10-CM   1. Kidney stone  N20.0     ED Discharge Orders         Ordered    oxyCODONE (ROXICODONE) 5 MG immediate release tablet  Every 4 hours PRN        06/11/20 0502    tamsulosin (FLOMAX) 0.4 MG CAPS capsule  Daily        06/11/20 0502           Discharge Instructions Discussed with and Provided to Patient:     Discharge Instructions     You were evaluated in the Emergency Department and after careful evaluation, we did not find any emergent condition requiring admission or further testing in the hospital.  Your exam/testing today was overall reassuring.  Your symptoms seem to be due to a kidney stone.  Please use Tylenol or Motrin at home for discomfort.  For more significant pain you can use the oxycodone medication provided.  Use the Flomax medication as directed to help you pass the kidney stone.  We recommend follow-up with a urologist.  Your CT also demonstrated a small increase in the size of your left renal mass.  Recommend follow-up imaging which can be scheduled by her primary care doctor.  Please return to the Emergency Department if you experience any worsening of your condition.  Thank you for allowing [place] to be a part of your care.        Maudie Flakes, MD 06/11/20 [id number]

## 2020-06-15 DIAGNOSIS — Z87442 Personal history of urinary calculi: Secondary | ICD-10-CM | POA: Diagnosis not present

## 2020-06-15 DIAGNOSIS — N2889 Other specified disorders of kidney and ureter: Secondary | ICD-10-CM | POA: Diagnosis not present

## 2020-06-19 ENCOUNTER — Other Ambulatory Visit: Payer: Self-pay | Admitting: Interventional Radiology

## 2020-06-19 DIAGNOSIS — N2889 Other specified disorders of kidney and ureter: Secondary | ICD-10-CM

## 2020-06-20 DIAGNOSIS — D49512 Neoplasm of unspecified behavior of left kidney: Secondary | ICD-10-CM | POA: Diagnosis not present

## 2020-06-20 DIAGNOSIS — N201 Calculus of ureter: Secondary | ICD-10-CM | POA: Diagnosis not present

## 2020-06-20 DIAGNOSIS — R311 Benign essential microscopic hematuria: Secondary | ICD-10-CM | POA: Diagnosis not present

## 2020-06-20 DIAGNOSIS — Z85528 Personal history of other malignant neoplasm of kidney: Secondary | ICD-10-CM | POA: Diagnosis not present

## 2020-06-20 DIAGNOSIS — Z8546 Personal history of malignant neoplasm of prostate: Secondary | ICD-10-CM | POA: Diagnosis not present

## 2020-06-22 ENCOUNTER — Ambulatory Visit
Admission: RE | Admit: 2020-06-22 | Discharge: 2020-06-22 | Disposition: A | Discharge status: Home / Self Care | Payer: Medicare Other | Source: Ambulatory Visit | Attending: Interventional Radiology | Admitting: Interventional Radiology

## 2020-06-22 ENCOUNTER — Other Ambulatory Visit: Payer: Self-pay

## 2020-06-22 DIAGNOSIS — N2889 Other specified disorders of kidney and ureter: Secondary | ICD-10-CM | POA: Diagnosis not present

## 2020-06-22 HISTORY — PX: IR RADIOLOGIST EVAL & MGMT: IMG5224

## 2020-06-22 NOTE — Progress Notes (Signed)
Chief Complaint: Patient was consulted remotely today (TeleHealth) for a possibly enlarging left renal lesion.  History of Present Illness: Eric Crosby is a 75 y.o. male who is status post cryoablation of a left renal mass on 06/06/2016 and prior cryoablation of a right renal mass on 01/27/2014. He was last seen on 12/02/2018 at which time ablation sites appeared stable and a small hyperdense cortical lesion at the tip of the lower pole of the left kidney was noted at the site of a previous small cyst which did not demonstrate enhancement and was felt to be consistent with hemorrhage into a pre-existing small cyst.  We decided at that time to perform a follow-up CT in 2 years.  Recently, Eric Crosby had acute left sided flank pain with unenhanced CT on 06/11/2020 demonstrating a 5 mm calculus in the distal ureter with associated left hydronephrosis.  At that time it was noted that the hyperdense cortical lesion of the inferior left kidney may have increased slightly in size from 7-8 mm to 10 mm.  Contrast was not administered.  He feels that he probably passed the calculus and now is asymptomatic.  Past Medical History:  Diagnosis Date  . Anxiety    hx of  . Arthritis    hx of  . Asthma    seasonal chronic  . Bipolar disorder ([place])    hx of  . Blood dyscrasia    POLYCYTHEMIA- NO MEDICATION OR TREATMENT NEEDED - PT TOLD TO MAKE EVERYONE AWARE HIS RBC CT HIGH - BUT NORMAL FOR HIM  . Complication of anesthesia   . Difficult intubation    Mask ventilation with an oral airway, grade III view with a MAC 4 and unable to pass bougie, grade II view with a glidescope. Recommend using glidescope for future intubations  . Diverticulosis    COLITIS  . History of kidney stones   . Hx of wheezing    OCCAS WHEEZING - POSS RELATED TO ALLERGIES OR ENVIRONMENTAL ISSUES-HAVE INHALER BUT HAS RARELY NEEDED  . Hypothyroidism   . OCP (ocular cicatricial pemphigoid)    IN REMISSION FOR PAST 18 YRS BUT  STILL EXPERIENCE EYE PAIN DAILY AND HX OF MULTIPLE SURGERIES  . Prostate cancer ([place]) 2011   TX'D WITH SEED IMPLANT  . Right renal mass   . Sleep apnea    PT USED CPAP FOR SEVERAL YEARS BUT IT MADE HIS EYE PROBLEM WORSE AND HE CAN NOT USE THE MACHINE NOW.    Past Surgical History:  Procedure Laterality Date  . colonoscopy    . EYE SURGERY     x 20  . eyelid surgery     17 SURGICAL PROCEDURES BLATERAL UPPER EYE LIDS  . HERNIA REPAIR     UMBILICAL  AND LOWER ABDOMINAL HERNIA REPAIR  . IR GENERIC HISTORICAL  03/25/2016   IR RADIOLOGIST EVAL & MGMT 03/25/2016 Aletta Edouard, MD [facility] INTERV RAD  . IR GENERIC HISTORICAL  07/11/2014   IR RADIOLOGIST EVAL & MGMT 07/11/2014 Aletta Edouard, MD [facility] INTERV RAD  . IR GENERIC HISTORICAL  07/09/2016   IR RADIOLOGIST EVAL & MGMT 07/09/2016 Aletta Edouard, MD [facility] INTERV RAD  . IR RADIOLOGIST EVAL & MGMT  09/09/2016  . IR RADIOLOGIST EVAL & MGMT  10/08/2017  . IR RADIOLOGIST EVAL & MGMT  12/02/2018  . NECK SURGERY     CERVICAL FUSION - PT STATES SLIGHT LIMITED ROM  . RADIOLOGY WITH ANESTHESIA Left 06/06/2016   Procedure: left renal cryoablation;  Surgeon: Aletta Edouard, MD;  Location: [facility];  Service: Radiology;  Laterality: Left;  . RIGHT CATARACT EXTRACTION WITH LENS IMPLANT    . RIGHT RETINAL SURGERY FOR BUCKLE    . SEED IMPLANT FOR PROSTATE CANCER    . WISDOM TOOTH EXTRACTION      Allergies: Adhesive [tape] and Sulfa antibiotics  Medications: Prior to Admission medications   Medication Sig Start Date End Date Taking? Authorizing Provider  albuterol (PROVENTIL HFA;VENTOLIN HFA) 108 (90 BASE) MCG/ACT inhaler Inhale 2 puffs into the lungs every 4 (four) hours as needed for wheezing or shortness of breath.     [provider]  ALPRAZolam Duanne Moron) 0.25 MG tablet Take 0.125 mg by mouth at bedtime as needed for anxiety.     [provider]  ARTIFICIAL TEARS 0.1-0.3 % SOLN Place 1 drop into both eyes as needed for dry eyes.     [provider]  calcium carbonate (TUMS - DOSED IN MG ELEMENTAL CALCIUM) 500 MG chewable tablet Chew 2 tablets by mouth 2 (two) times daily as needed for indigestion or heartburn.    [provider]  Carboxymethylcell-Hypromellose (GENTEAL OP) Apply 1 application to eye as needed (dry eyes).    [provider]  Ibuprofen (ADVIL) 200 MG CAPS Take 400-600 mg by mouth See admin instructions. Pt takes three tablets in the morning (600 mg) and two tablets at night (434m)    [provider]  levothyroxine (SYNTHROID, LEVOTHROID) 137 MCG tablet Take 137 mcg by mouth daily before breakfast.    [provider]  meclizine (ANTIVERT) 25 MG tablet Take 25 mg by mouth daily as needed for dizziness.     [provider]  Multiple Vitamins-Minerals (ALIVE MENS ENERGY PO) Take 1 tablet by mouth daily.    [provider]  oxyCODONE (ROXICODONE) 5 MG immediate release tablet Take 1 tablet (5 mg total) by mouth every 4 (four) hours as needed for severe pain. 06/11/20   BMaudie Flakes MD  pantoprazole (PROTONIX) 40 MG tablet Take 40 mg by mouth every evening.  07/29/16   [provider]  [facility] Center[facility]ort 300 MG CAPS Take 300 mg by mouth daily.    [provider]  tamsulosin (FLOMAX) 0.4 MG CAPS capsule Take 1 capsule (0.4 mg total) by mouth daily. 06/11/20   BMaudie Flakes MD     No family history on file.  Social History   Socioeconomic History  . Marital status: Married    Spouse name: Not on file  . Number of children: Not on file  . Years of education: Not on file  . Highest education level: Not on file  Occupational History  . Not on file  Tobacco Use  . Smoking status: Former SResearch scientist (life sciences) . Smokeless tobacco: Never Used  . Tobacco comment: 26 years ago  Substance and Sexual Activity  . Alcohol use: No  . Drug use: No  . Sexual activity: Yes  Other Topics Concern  . Not on file  Social History Narrative  . Not on file    Social Determinants of Health   Financial Resource Strain: Not on file  Food Insecurity: Not on file  Transportation Needs: Not on file  Physical Activity: Not on file  Stress: Not on file  Social Connections: Not on file     Review of Systems  Constitutional: Negative.   Respiratory: Negative.   Cardiovascular: Negative.   Gastrointestinal: Negative.   Genitourinary: Negative.   Musculoskeletal: Negative.  Neurological: Negative.     Review of Systems: A 12 point ROS discussed and pertinent positives are indicated in the HPI above.  All other systems are negative.  Physical Exam No direct physical exam was performed (except for noted visual exam findings with Video Visits).   Vital Signs: There were no vitals taken for this visit.  Imaging: CT RENAL STONE STUDY  Result Date: 06/11/2020 CLINICAL DATA:  Flank pain, kidney stone suspected left flank LLQ pain EXAM: CT ABDOMEN AND PELVIS WITHOUT CONTRAST TECHNIQUE: Multidetector CT imaging of the abdomen and pelvis was performed following the standard protocol without IV contrast. COMPARISON:  Ct abdomen pelvis 11/30/2018. FINDINGS: Lower chest: Right middle lobe tree-in-bud nodularity. At least small volume hiatal hernia. Hepatobiliary: No focal liver abnormality. No gallstones, gallbladder wall thickening, or pericholecystic fluid. No biliary dilatation. Pancreas: Diffusely atrophic. No focal lesion. Otherwise normal pancreatic contour. No surrounding inflammatory changes. No main pancreatic ductal dilatation. Spleen:  Normal in size without focal abnormality. Adrenals/Urinary Tract: No adrenal nodule bilaterally. Several calcified 1-3 mm stones within the kidneys. No right ureterolithiasis. There is a 4 mm calcified distal left ureteral stone with associated proximal mild to moderate hydroureteronephrosis. No right hydronephrosis. Pericentimeter lesion at the left superior renal pole likely represents a simple renal cyst.  Subcentimeter calcified exophytic lesion along the inferior pole of the right kidney (3:39) in knee region of a prior ablation site appear slightly decreased in size but with increased calcifications compared to prior. Suggestion of interval increase in size of a 1 cm (from 0.7 cm) left inferior pole lesion that is incompletely evaluated on this study 6:61). No ureterolithiasis or hydroureter. The urinary bladder is unremarkable. Stomach/Bowel: Stomach is within normal limits. No evidence of bowel wall thickening or dilatation. Diffuse descending colon and sigmoid diverticulosis. Appendix appears normal. Vascular/Lymphatic: No abdominal aorta or iliac aneurysm. Mild to moderate atherosclerotic plaque of the aorta and its branches. No abdominal, pelvic, or inguinal lymphadenopathy. Reproductive: Radiotherapy seeds of the prostate. Other: No intraperitoneal free fluid. No intraperitoneal free gas. No organized fluid collection. Musculoskeletal: Tiny fat containing umbilical hernia. No suspicious lytic or blastic osseous lesions. No acute displaced fracture. Multilevel degenerative changes of the spine with intervertebral disc space vacuum phenomenon at the L4-L5 level. IMPRESSION: 1. Obstructive 4 mm distal left ureterolithiasis. 2. Right middle lobe tree-in-bud nodularity suggestive of infection/inflammation. Nonobstructive bilateral 1-3 mm nephrolithiasis. 3. Interval increase in size of an indeterminate 1 cm left inferior renal pole lesion. Increased calcifications but decreased size of a right inferior renal pole lesion in this region of prior ablation. Recommend CT or MR renal ultrasound further evaluation as malignancy cannot be excluded. 4. Other imaging findings of potential clinical significance: Colonic diverticulosis with no acute diverticulitis. At least small volume hiatal hernia. Aortic Atherosclerosis (ICD10-I70.0). Electronically Signed   By: Iven Finn M.D.   On: 06/11/2020 04:51     Labs:  CBC: Recent Labs    06/10/20 2300  WBC 14.2*  HGB 17.2*  HCT 52.3*  PLT 289    COAGS: No results for input(s): INR, APTT in the last 8760 hours.  BMP: Recent Labs    06/10/20 2300  NA 141  K 4.4  CL 107  CO2 24  GLUCOSE 137*  BUN 13  CALCIUM 9.7  CREATININE 1.07  GFRNONAA >60    Assessment and Plan:  I spoke with Eric Crosby over the phone and reviewed the recent 06/11/2020 CT of the abdomen and pelvis without contrast.  The hyperdense  cyst of the posterior lower pole of the left kidney is minimally more prominent in appearance.  At that time, there is increased perinephric stranding as well as edema of the left kidney due to hydronephrosis from acute renal obstruction.  I told Eric Crosby that I am not very concerned about this lesion and still suspect it is a small hemorrhagic cyst.  Any cyst can increase in size over time.    Since we were planning to perform a CT of the abdomen with and without contrast for follow-up in approximately July of this year, we could simply move up that scan a couple months to April or May.  I do not think that MRI of the abdomen is necessary at this point unless the lesion clearly demonstrates enhancement and shows growth over time.  We will check renal function before CT and I will review imaging findings with Eric Crosby after the follow-up CT is performed in roughly April or May of this year.   Electronically Signed: Azzie Roup 06/22/2020, 10:57 AM     I spent a total of  10 Minutes in remote  clinical consultation, greater than 50% of which was counseling/coordinating care for left renal lesion post prior cryoablation.    Visit type: Audio only (telephone). Audio (no video) only due to patient's lack of internet/smartphone capability. Alternative for in-person consultation at [facility], [address] [place], [place], [place]. This visit type was conducted due to national recommendations for restrictions regarding the  COVID-19 Pandemic (e.g. social distancing).  This format is felt to be most appropriate for this patient at this time.  All issues noted in this document were discussed and addressed.

## 2020-08-08 ENCOUNTER — Other Ambulatory Visit: Payer: Self-pay | Admitting: Interventional Radiology

## 2020-08-08 ENCOUNTER — Other Ambulatory Visit: Payer: Self-pay

## 2020-08-08 DIAGNOSIS — N2889 Other specified disorders of kidney and ureter: Secondary | ICD-10-CM

## 2020-08-10 DIAGNOSIS — N2889 Other specified disorders of kidney and ureter: Secondary | ICD-10-CM | POA: Diagnosis not present

## 2020-08-11 LAB — COMPLETE METABOLIC PANEL WITH GFR
AG Ratio: 2.1 (calc) (ref 1.0–2.5)
ALT: 18 U/L (ref 9–46)
AST: 21 U/L (ref 10–35)
Albumin: 4.4 g/dL (ref 3.6–5.1)
Alkaline phosphatase (APISO): 80 U/L (ref 35–144)
BUN: 14 mg/dL (ref 7–25)
CO2: 27 mmol/L (ref 20–32)
Calcium: 10.5 mg/dL — ABNORMAL HIGH (ref 8.6–10.3)
Chloride: 108 mmol/L (ref 98–110)
Creat: 0.93 mg/dL (ref 0.70–1.18)
GFR, Est African American: 93 mL/min/{1.73_m2} (ref >=60)
GFR, Est Non African American: 81 mL/min/{1.73_m2} (ref >=60)
Globulin: 2.1 g/dL (calc) (ref 1.9–3.7)
Glucose, Bld: 90 mg/dL (ref 65–139)
Potassium: 4.2 mmol/L (ref 3.5–5.3)
Sodium: 143 mmol/L (ref 135–146)
Total Bilirubin: 0.6 mg/dL (ref 0.2–1.2)
Total Protein: 6.5 g/dL (ref 6.1–8.1)

## 2020-09-07 ENCOUNTER — Ambulatory Visit (HOSPITAL_COMMUNITY)
Admission: RE | Admit: 2020-09-07 | Discharge: 2020-09-07 | Disposition: A | Discharge status: Home / Self Care | Payer: Medicare Other | Source: Ambulatory Visit | Attending: Interventional Radiology | Admitting: Interventional Radiology

## 2020-09-07 ENCOUNTER — Other Ambulatory Visit: Payer: Self-pay

## 2020-09-07 ENCOUNTER — Encounter (HOSPITAL_COMMUNITY): Payer: Self-pay

## 2020-09-07 DIAGNOSIS — N200 Calculus of kidney: Secondary | ICD-10-CM | POA: Diagnosis not present

## 2020-09-07 DIAGNOSIS — K449 Diaphragmatic hernia without obstruction or gangrene: Secondary | ICD-10-CM | POA: Diagnosis not present

## 2020-09-07 DIAGNOSIS — K429 Umbilical hernia without obstruction or gangrene: Secondary | ICD-10-CM | POA: Diagnosis not present

## 2020-09-07 DIAGNOSIS — N2889 Other specified disorders of kidney and ureter: Secondary | ICD-10-CM | POA: Diagnosis not present

## 2020-09-07 MED ORDER — IOHEXOL 300 MG/ML  SOLN
100.0000 mL | Freq: Once | INTRAMUSCULAR | Status: AC | PRN
  Administered 2020-09-07: 100 mL via INTRAVENOUS

## 2020-09-12 ENCOUNTER — Encounter: Payer: Self-pay | Admitting: *Deleted

## 2020-09-12 ENCOUNTER — Ambulatory Visit
Admission: RE | Admit: 2020-09-12 | Discharge: 2020-09-12 | Disposition: A | Discharge status: Home / Self Care | Payer: Medicare Other | Source: Ambulatory Visit | Attending: Interventional Radiology | Admitting: Interventional Radiology

## 2020-09-12 DIAGNOSIS — N2889 Other specified disorders of kidney and ureter: Secondary | ICD-10-CM

## 2020-09-12 HISTORY — PX: IR RADIOLOGIST EVAL & MGMT: IMG5224

## 2020-09-12 NOTE — Progress Notes (Signed)
Chief Complaint: Patient was seen in consultation today for follow-up after prior bilateral renal ablation.  History of Present Illness: Eric Crosby is a 75 y.o. male status post cryoablation of a left renal mass on 06/06/2016 and prior cryoablation of a right renal mass on 01/27/2014. He was last seen on 12/02/2018 at which time ablation sites appeared stable and a small hyperdense cortical lesion at the tip of the lower pole of the left kidney was noted at the site of a previous small cyst which did not demonstrate enhancement and was felt to be consistent with hemorrhage into a pre-existing small cyst.  Unenhanced CT of the abdomen on 06/11/2020 demonstrated potential slight increase in size of this lesion/complex cyst.  A multiphase CT of the abdomen with and without contrast was performed in follow-up on 09/07/2020. [name] is currently asymptomatic.  Past Medical History:  Diagnosis Date  . Anxiety    hx of  . Arthritis    hx of  . Asthma    seasonal chronic  . Bipolar disorder ([place])    hx of  . Blood dyscrasia    POLYCYTHEMIA- NO MEDICATION OR TREATMENT NEEDED - PT TOLD TO MAKE EVERYONE AWARE HIS RBC CT HIGH - BUT NORMAL FOR HIM  . Complication of anesthesia   . Difficult intubation    Mask ventilation with an oral airway, grade III view with a MAC 4 and unable to pass bougie, grade II view with a glidescope. Recommend using glidescope for future intubations  . Diverticulosis    COLITIS  . History of kidney stones   . Hx of wheezing    OCCAS WHEEZING - POSS RELATED TO ALLERGIES OR ENVIRONMENTAL ISSUES-HAVE INHALER BUT HAS RARELY NEEDED  . Hypothyroidism   . 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 Prostate cancer ([place]) 2011   TX'D WITH SEED IMPLANT  . Right renal mass   . Sleep apnea    PT USED CPAP FOR SEVERAL YEARS BUT IT MADE HIS EYE PROBLEM WORSE AND HE CAN NOT USE THE  MACHINE NOW.    Past Surgical History:  Procedure Laterality Date  . colonoscopy    . EYE SURGERY     x 20  . eyelid surgery     17 SURGICAL PROCEDURES BLATERAL UPPER EYE LIDS  . HERNIA REPAIR     UMBILICAL  AND LOWER ABDOMINAL HERNIA REPAIR  . IR GENERIC HISTORICAL  03/25/2016   IR RADIOLOGIST EVAL & MGMT 03/25/2016 Aletta Edouard, MD [facility]  . IR GENERIC HISTORICAL  07/11/2014   IR RADIOLOGIST EVAL & MGMT 07/11/2014 Aletta Edouard, MD [facility]  . IR GENERIC HISTORICAL  07/09/2016   IR RADIOLOGIST EVAL & MGMT 07/09/2016 Aletta Edouard, MD [facility]  . IR RADIOLOGIST EVAL & MGMT  09/09/2016  . IR RADIOLOGIST EVAL & MGMT  10/08/2017  . IR RADIOLOGIST EVAL & MGMT  12/02/2018  . IR RADIOLOGIST EVAL & MGMT  06/22/2020  . NECK SURGERY     CERVICAL FUSION - PT STATES SLIGHT LIMITED ROM  . RADIOLOGY WITH ANESTHESIA Left 06/06/2016   Procedure: left renal cryoablation;  Surgeon: Aletta Edouard, MD;  Location: [facility];  Service: Radiology;  Laterality: Left;  . RIGHT CATARACT EXTRACTION WITH LENS IMPLANT    . RIGHT RETINAL SURGERY FOR BUCKLE    . SEED IMPLANT FOR PROSTATE CANCER    .  WISDOM TOOTH EXTRACTION      Allergies: Adhesive [tape] and Sulfa antibiotics  Medications: Prior to Admission medications   Medication Sig Start Date End Date Taking? Authorizing Provider  albuterol (PROVENTIL HFA;VENTOLIN HFA) 108 (90 BASE) MCG/ACT inhaler Inhale 2 puffs into the lungs every 4 (four) hours as needed for wheezing or shortness of breath.     [provider]  ALPRAZolam Duanne Moron) 0.25 MG tablet Take 0.125 mg by mouth at bedtime as needed for anxiety.     [provider]  ARTIFICIAL TEARS 0.1-0.3 % SOLN Place 1 drop into both eyes as needed for dry eyes.    [provider]  calcium carbonate (TUMS - DOSED IN MG ELEMENTAL CALCIUM) 500 MG chewable tablet Chew 2 tablets by mouth 2 (two) times daily as needed for indigestion or heartburn.    [provider]  Carboxymethylcell-Hypromellose (GENTEAL OP) Apply 1 application to eye as needed (dry eyes).    [provider]  Ibuprofen (ADVIL) 200 MG CAPS Take 400-600 mg by mouth See admin instructions. Pt takes three tablets in the morning (600 mg) and two tablets at night (417m)    [provider]  levothyroxine (SYNTHROID, LEVOTHROID) 137 MCG tablet Take 137 mcg by mouth daily before breakfast.    [provider]  meclizine (ANTIVERT) 25 MG tablet Take 25 mg by mouth daily as needed for dizziness.     [provider]  Multiple Vitamins-Minerals (ALIVE MENS ENERGY PO) Take 1 tablet by mouth daily.    [provider]  oxyCODONE (ROXICODONE) 5 MG immediate release tablet Take 1 tablet (5 mg total) by mouth every 4 (four) hours as needed for severe pain. 06/11/20   BMaudie Flakes MD  pantoprazole (PROTONIX) 40 MG tablet Take 40 mg by mouth every evening.  07/29/16   [provider]  [facility] 300 MG CAPS Take 300 mg by mouth daily.    [provider]  tamsulosin (FLOMAX) 0.4 MG CAPS capsule Take 1 capsule (0.4 mg total) by mouth daily. 06/11/20   BMaudie Flakes MD     No family history on file.  Social History   Socioeconomic History  . Marital status: Married    Spouse name: Not on file  . Number of children: Not on file  . Years of education: Not on file  . Highest education level: Not on file  Occupational History  . Not on file  Tobacco Use  . Smoking status: Former SResearch scientist (life sciences) . Smokeless tobacco: Never Used  . Tobacco comment: 26 years ago  Substance and Sexual Activity  . Alcohol use: No  . Drug use: No  . Sexual activity: Yes  Other Topics Concern  . Not on file  Social History Narrative  . Not on file   Social Determinants of Health   Financial Resource Strain: Not on file  Food Insecurity: Not on file  Transportation Needs: Not on file  Physical Activity: Not on file  Stress: Not on file  Social  Connections: Not on file    Review of Systems: A 12 point ROS discussed and pertinent positives are indicated in the HPI above.  All other systems are negative.  Review of Systems  Constitutional: Negative.   Respiratory: Negative.   Cardiovascular: Negative.   Gastrointestinal: Negative.   Genitourinary: Negative.   Musculoskeletal: Negative.   Neurological: Negative.     Vital Signs: BP (!) 148/86 (BP Location: Right Arm)   Pulse 77  SpO2 96%    Imaging: CT ABDOMEN W WO CONTRAST  Result Date: 09/07/2020 CLINICAL DATA:  Prior operations of prior renal lesions, on the right and 2016 and on the left and 2018. Recent CT of 06/11/2020 showed increase in size of an indeterminate left lower pole lesion, for further characterization. EXAM: CT ABDOMEN WITHOUT AND WITH CONTRAST TECHNIQUE: Multidetector CT imaging of the abdomen was performed following the standard protocol before and following the bolus administration of intravenous contrast. CONTRAST:  157m OMNIPAQUE IOHEXOL 300 MG/ML  SOLN COMPARISON:  Multiple exams, including 06/11/2020 and 11/30/2018 FINDINGS: Lower chest: Moderate-sized type 3 hiatal hernia. Aortic atherosclerotic vascular calcification. Tree-in-bud reticulonodular opacities in the right middle lobe compatible with atypical infectious bronchiolitis. Similar and stable mild nodularity in the lingula common no change from 11/30/2018. Hepatobiliary: Unremarkable Pancreas: Punctate calcifications along the somewhat atrophic pancreatic head probably from prior chronic calcific pancreatitis. Spleen: Unremarkable Adrenals/Urinary Tract: Both adrenal glands appear normal. 1.0 by 1.0 cm exophytic lesion of the left kidney lower pole on image 63 series 14 does not appear to enhance and has a precontrast density of 42 Hounsfield units, compatible with a Bosniak category 2 cyst. Bosniak category 1 cyst of the left kidney upper pole, 1.6 by 1.6 cm on image 64 series 14. Other small  bilateral hypodense renal lesions are likely cysts but technically too small to characterize. Previous ablation sites in the right kidney lower pole posteriorly and left kidney lower pole medially without findings of recurrence. There are 2 right kidney lower pole nonobstructive renal calculi each about 3 mm in diameter. One of these is in a calyceal diverticulum near the ablation site. There are approximately 5 nonobstructive left renal calculi, the largest measuring 0.4 cm in diameter in the upper pole. For Stomach/Bowel: Moderate-sized type 3 hiatal hernia. Descending colon diverticulosis. Vascular/Lymphatic: Aortoiliac atherosclerotic vascular disease. No pathologic adenopathy. No tumor thrombus in either renal vein. Other: No supplemental non-categorized findings. Musculoskeletal: Small umbilical hernia contains adipose tissue. Lumbar spondylosis and degenerative disc disease causing multilevel impingement. IMPRESSION: 1. 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 2. No findings of recurrence along the right renal or left renal ablation sites. No findings of active malignancy. 3. Other imaging findings of potential clinical significance: Moderate-sized type 3 hiatal hernia. Aortic Atherosclerosis (ICD10-I70.0). Continued findings of atypical infectious bronchiolitis in the right middle lobe and to a lesser degree in the lingula. Descending colon diverticulosis. Small umbilical hernia contains adipose tissue. Multilevel lumbar impingement. Electronically Signed   By: WVan ClinesM.D.   On: 09/07/2020 15:11    Labs:  CBC: Recent Labs    06/10/20 2300  WBC 14.2*  HGB 17.2*  HCT 52.3*  PLT 289    COAGS: No results for input(s): INR, APTT in the last 8760 hours.  BMP: Recent Labs    06/10/20 2300 08/10/20 1536  NA 141 143  K 4.4 4.2  CL 107 108  CO2 24 27  GLUCOSE 137* 90  BUN 13 14  CALCIUM 9.7 10.5*  CREATININE  1.07 0.93  GFRNONAA >60 81  GFRAA  --  93    LIVER FUNCTION TESTS: Recent Labs    08/10/20 1536  BILITOT 0.6  AST 21  ALT 18  PROT 6.5    Assessment and Plan:  I met with Mr. JSaulters  We reviewed imaging from the CT study on 09/07/2020.  This demonstrates stable bilateral renal ablation defects without  evidence of recurrent neoplasm or abnormal enhancement.  The 1 cm exophytic cortical lesion along the lower pole of the left kidney has a higher density before administration of contrast and does not enhance on arterial and venous phases.  This is considered compatible with a Bosniak II cyst.   The right renal ablation site has now been stable for nearly 7 years and the left renal ablation site for just over 4 years.  Although technically the Bosniak II cyst does not require imaging follow-up, I did offer another 1 year follow-up scan to get to the 5-year follow-up interval for the left renal ablation site and at that time we will be able to reevaluate the complex cyst of the left kidney.  At that time if things are stable, routine imaging of the kidneys can be discontinued.  Mr. Lucchesi is agreeable and would like to proceed with an additional follow-up scan in 1 year.   Electronically Signed: Azzie Roup 09/12/2020, 10:26 AM     I spent a total of 15 Minutes in face to face in clinical consultation, greater than 50% of which was counseling/coordinating care post cryoablation of bilateral renal masses.

## 2020-09-27 DIAGNOSIS — H16443 Deep vascularization of cornea, bilateral: Secondary | ICD-10-CM | POA: Diagnosis not present

## 2020-09-27 DIAGNOSIS — H02054 Trichiasis without entropian left upper eyelid: Secondary | ICD-10-CM | POA: Diagnosis not present

## 2020-09-27 DIAGNOSIS — H02051 Trichiasis without entropian right upper eyelid: Secondary | ICD-10-CM | POA: Diagnosis not present

## 2020-09-27 DIAGNOSIS — H2512 Age-related nuclear cataract, left eye: Secondary | ICD-10-CM | POA: Diagnosis not present

## 2020-09-27 DIAGNOSIS — H16213 Exposure keratoconjunctivitis, bilateral: Secondary | ICD-10-CM | POA: Diagnosis not present

## 2020-09-27 DIAGNOSIS — H5213 Myopia, bilateral: Secondary | ICD-10-CM | POA: Diagnosis not present

## 2020-11-23 DIAGNOSIS — Z0000 Encounter for general adult medical examination without abnormal findings: Secondary | ICD-10-CM | POA: Diagnosis not present

## 2020-11-23 DIAGNOSIS — I700 Atherosclerosis of aorta: Secondary | ICD-10-CM | POA: Diagnosis not present

## 2020-11-23 DIAGNOSIS — E039 Hypothyroidism, unspecified: Secondary | ICD-10-CM | POA: Diagnosis not present

## 2020-11-23 DIAGNOSIS — I77810 Thoracic aortic ectasia: Secondary | ICD-10-CM | POA: Diagnosis not present

## 2020-12-20 DIAGNOSIS — N200 Calculus of kidney: Secondary | ICD-10-CM | POA: Diagnosis not present

## 2020-12-20 DIAGNOSIS — Z8546 Personal history of malignant neoplasm of prostate: Secondary | ICD-10-CM | POA: Diagnosis not present

## 2020-12-20 DIAGNOSIS — Z85528 Personal history of other malignant neoplasm of kidney: Secondary | ICD-10-CM | POA: Diagnosis not present

## 2021-03-19 DIAGNOSIS — F419 Anxiety disorder, unspecified: Secondary | ICD-10-CM | POA: Diagnosis not present

## 2021-04-22 DIAGNOSIS — D2261 Melanocytic nevi of right upper limb, including shoulder: Secondary | ICD-10-CM | POA: Diagnosis not present

## 2021-04-22 DIAGNOSIS — L720 Epidermal cyst: Secondary | ICD-10-CM | POA: Diagnosis not present

## 2021-04-22 DIAGNOSIS — D225 Melanocytic nevi of trunk: Secondary | ICD-10-CM | POA: Diagnosis not present

## 2021-04-22 DIAGNOSIS — L821 Other seborrheic keratosis: Secondary | ICD-10-CM | POA: Diagnosis not present

## 2021-08-15 DIAGNOSIS — G629 Polyneuropathy, unspecified: Secondary | ICD-10-CM | POA: Diagnosis not present

## 2021-09-04 ENCOUNTER — Other Ambulatory Visit: Payer: Self-pay | Admitting: Interventional Radiology

## 2021-09-04 DIAGNOSIS — N2889 Other specified disorders of kidney and ureter: Secondary | ICD-10-CM

## 2021-09-17 ENCOUNTER — Other Ambulatory Visit: Payer: Self-pay | Admitting: Interventional Radiology

## 2021-09-17 DIAGNOSIS — N2889 Other specified disorders of kidney and ureter: Secondary | ICD-10-CM

## 2021-09-19 ENCOUNTER — Ambulatory Visit (HOSPITAL_COMMUNITY)
Admission: RE | Admit: 2021-09-19 | Discharge: 2021-09-19 | Disposition: A | Discharge status: Home / Self Care | Payer: Medicare Other | Source: Ambulatory Visit | Attending: Interventional Radiology | Admitting: Interventional Radiology

## 2021-09-19 DIAGNOSIS — N281 Cyst of kidney, acquired: Secondary | ICD-10-CM | POA: Diagnosis not present

## 2021-09-19 DIAGNOSIS — N2889 Other specified disorders of kidney and ureter: Secondary | ICD-10-CM | POA: Diagnosis not present

## 2021-09-19 DIAGNOSIS — K449 Diaphragmatic hernia without obstruction or gangrene: Secondary | ICD-10-CM | POA: Diagnosis not present

## 2021-09-19 DIAGNOSIS — N200 Calculus of kidney: Secondary | ICD-10-CM | POA: Diagnosis not present

## 2021-09-19 DIAGNOSIS — N1330 Unspecified hydronephrosis: Secondary | ICD-10-CM | POA: Diagnosis not present

## 2021-09-19 LAB — POCT I-STAT CREATININE: Creatinine, Ser: 1.1 mg/dL (ref 0.61–1.24)

## 2021-09-19 MED ORDER — IOHEXOL 350 MG/ML SOLN
100.0000 mL | Freq: Once | INTRAVENOUS | Status: AC | PRN
  Administered 2021-09-19: 100 mL via INTRAVENOUS

## 2021-09-19 MED ORDER — SODIUM CHLORIDE (PF) 0.9 % IJ SOLN
INTRAMUSCULAR | Status: AC
  Filled 2021-09-19: qty 50

## 2021-09-23 DIAGNOSIS — B029 Zoster without complications: Secondary | ICD-10-CM | POA: Diagnosis not present

## 2021-09-23 DIAGNOSIS — H5711 Ocular pain, right eye: Secondary | ICD-10-CM | POA: Diagnosis not present

## 2021-09-25 DIAGNOSIS — B029 Zoster without complications: Secondary | ICD-10-CM | POA: Diagnosis not present

## 2021-09-26 DIAGNOSIS — H02831 Dermatochalasis of right upper eyelid: Secondary | ICD-10-CM | POA: Diagnosis not present

## 2021-09-26 DIAGNOSIS — H1789 Other corneal scars and opacities: Secondary | ICD-10-CM | POA: Diagnosis not present

## 2021-09-26 DIAGNOSIS — B0239 Other herpes zoster eye disease: Secondary | ICD-10-CM | POA: Diagnosis not present

## 2021-09-26 DIAGNOSIS — H0100A Unspecified blepharitis right eye, upper and lower eyelids: Secondary | ICD-10-CM | POA: Diagnosis not present

## 2021-09-30 ENCOUNTER — Ambulatory Visit
Admission: RE | Admit: 2021-09-30 | Discharge: 2021-09-30 | Disposition: A | Discharge status: Home / Self Care | Payer: Medicare Other | Source: Ambulatory Visit | Attending: Interventional Radiology | Admitting: Interventional Radiology

## 2021-09-30 ENCOUNTER — Encounter: Payer: Self-pay | Admitting: *Deleted

## 2021-09-30 DIAGNOSIS — Z9889 Other specified postprocedural states: Secondary | ICD-10-CM | POA: Diagnosis not present

## 2021-09-30 DIAGNOSIS — N2889 Other specified disorders of kidney and ureter: Secondary | ICD-10-CM

## 2021-09-30 HISTORY — PX: IR RADIOLOGIST EVAL & MGMT: IMG5224

## 2021-09-30 NOTE — Progress Notes (Signed)
Chief Complaint: Patient was consulted remotely today (TeleHealth) for follow-up after prior bilateral renal ablation.  History of Present Illness: Eric Crosby is a 76 y.o. male status post cryoablation of a left renal mass on 06/06/2016 and prior cryoablation of a right renal mass on 01/27/2014. He was last seen on 12/02/2018 at which time ablation sites appeared stable and a small hyperdense cortical lesion at the tip of the lower pole of the left kidney was noted at the site of a previous small cyst which did not demonstrate enhancement and was felt to be consistent with hemorrhage into a pre-existing small cyst.  Unenhanced CT of the abdomen on 06/11/2020 demonstrated potential slight increase in size of this lesion/complex cyst.  A multiphase CT of the abdomen with and without contrast was performed in follow-up on 09/07/2020 demonstrating characteristics of a Bosniak II cyst. Eric Crosby is currently recovering from shingles affecting his forehead and around his eyes over the past week, which is improving.  Past Medical History:  Diagnosis Date   Anxiety    hx of   Arthritis    hx of   Asthma    seasonal chronic   Bipolar disorder ([facility])    hx of   Blood dyscrasia    POLYCYTHEMIA- NO MEDICATION OR TREATMENT NEEDED - PT TOLD TO MAKE EVERYONE AWARE HIS RBC CT HIGH - BUT NORMAL FOR HIM   Complication of anesthesia    Difficult intubation    Mask ventilation with an oral airway, grade III view with a MAC 4 and unable to pass bougie, grade II view with a glidescope. Recommend using glidescope for future intubations   Diverticulosis    COLITIS   History of kidney stones    Hx of wheezing    OCCAS WHEEZING - POSS RELATED TO ALLERGIES OR ENVIRONMENTAL ISSUES-HAVE INHALER BUT HAS RARELY NEEDED   Hypothyroidism    OCP (ocular cicatricial pemphigoid)    IN REMISSION FOR PAST 18 YRS BUT STILL EXPERIENCE EYE PAIN DAILY AND HX OF MULTIPLE SURGERIES   Prostate cancer ([place]) 2011   TX'D WITH  SEED IMPLANT   Right renal mass    Sleep apnea    PT USED CPAP FOR SEVERAL YEARS BUT IT MADE HIS EYE PROBLEM WORSE AND HE CAN NOT USE THE MACHINE NOW.    Past Surgical History:  Procedure Laterality Date   colonoscopy     EYE SURGERY     x 20   eyelid surgery     17 SURGICAL PROCEDURES BLATERAL UPPER EYE LIDS   HERNIA REPAIR     UMBILICAL  AND LOWER ABDOMINAL HERNIA REPAIR   IR GENERIC HISTORICAL  03/25/2016   IR RADIOLOGIST EVAL & MGMT 03/25/2016 Eric Edouard, MD [facility]   IR GENERIC HISTORICAL  07/11/2014   IR RADIOLOGIST EVAL & MGMT 07/11/2014 Eric Edouard, MD [facility]   IR GENERIC HISTORICAL  07/09/2016   IR RADIOLOGIST EVAL & MGMT 07/09/2016 Eric Edouard, MD [facility]   IR RADIOLOGIST EVAL & MGMT  09/09/2016   IR RADIOLOGIST EVAL & MGMT  10/08/2017   IR RADIOLOGIST EVAL & MGMT  12/02/2018   IR RADIOLOGIST EVAL & MGMT  06/22/2020   IR RADIOLOGIST EVAL & MGMT  09/12/2020   NECK SURGERY     CERVICAL FUSION - PT STATES SLIGHT LIMITED ROM   RADIOLOGY WITH ANESTHESIA Left 06/06/2016   Procedure: left renal cryoablation;  Surgeon: Eric Edouard, MD;  Location: [facility];  Service: Radiology;  Laterality: Left;   RIGHT CATARACT EXTRACTION WITH LENS IMPLANT     RIGHT RETINAL SURGERY FOR BUCKLE     SEED IMPLANT FOR PROSTATE CANCER     WISDOM TOOTH EXTRACTION      Allergies: Adhesive [tape] and Sulfa antibiotics  Medications: Prior to Admission medications   Medication Sig Start Date End Date Taking? Authorizing Provider  albuterol (PROVENTIL HFA;VENTOLIN HFA) 108 (90 BASE) MCG/ACT inhaler Inhale 2 puffs into the lungs every 4 (four) hours as needed for wheezing or shortness of breath.     [provider]  ALPRAZolam Duanne Moron) 0.25 MG tablet Take 0.125 mg by mouth at bedtime as needed for anxiety.     [provider]  ARTIFICIAL TEARS 0.1-0.3 % SOLN Place 1 drop into both eyes as needed for dry eyes.    [provider]  calcium  carbonate (TUMS - DOSED IN MG ELEMENTAL CALCIUM) 500 MG chewable tablet Chew 2 tablets by mouth 2 (two) times daily as needed for indigestion or heartburn.    [provider]  Carboxymethylcell-Hypromellose (GENTEAL OP) Apply 1 application to eye as needed (dry eyes).    [provider]  Ibuprofen (ADVIL) 200 MG CAPS Take 400-600 mg by mouth See admin instructions. Pt takes three tablets in the morning (600 mg) and two tablets at night (453m)    [provider]  levothyroxine (SYNTHROID, LEVOTHROID) 137 MCG tablet Take 137 mcg by mouth daily before breakfast.    [provider]  meclizine (ANTIVERT) 25 MG tablet Take 25 mg by mouth daily as needed for dizziness.     [provider]  Multiple Vitamins-Minerals (ALIVE MENS ENERGY PO) Take 1 tablet by mouth daily.    [provider]  oxyCODONE (ROXICODONE) 5 MG immediate release tablet Take 1 tablet (5 mg total) by mouth every 4 (four) hours as needed for severe pain. 06/11/20   BMaudie Flakes MD  pantoprazole (PROTONIX) 40 MG tablet Take 40 mg by mouth every evening.  07/29/16   [provider]  [facility]ort 300 MG CAPS Take 300 mg by mouth daily.    [provider]  tamsulosin (FLOMAX) 0.4 MG CAPS capsule Take 1 capsule (0.4 mg total) by mouth daily. 06/11/20   BMaudie Flakes MD     No family history on file.  Social History   Socioeconomic History   Marital status: Married    Spouse name: Not on file   Number of children: Not on file   Years of education: Not on file   Highest education level: Not on file  Occupational History   Not on file  Tobacco Use   Smoking status: Former   Smokeless tobacco: Never   Tobacco comments:    26 years ago  Substance and Sexual Activity   Alcohol use: No   Drug use: No   Sexual activity: Yes  Other Topics Concern   Not on file  Social History Narrative   Not on file   Social Determinants of Health   Financial Resource  Strain: Not on file  Food Insecurity: Not on file  Transportation Needs: Not on file  Physical Activity: Not on file  Stress: Not on file  Social Connections: Not on file    ECOG Status: 0 - Asymptomatic  Review of Systems  Constitutional: Negative.   Eyes:  Positive for redness.  Respiratory: Negative.    Cardiovascular: Negative.   Gastrointestinal: Negative.   Genitourinary: Negative.  Musculoskeletal: Negative.   Skin:  Positive for rash.  Neurological: Negative.    Review of Systems: A 12 point ROS discussed and pertinent positives are indicated in the HPI above.  All other systems are negative.  Physical Exam No direct physical exam was performed (except for noted visual exam findings with Video Visits).   Vital Signs: There were no vitals taken for this visit.  Imaging: CT ABDOMEN W WO CONTRAST  Result Date: 09/21/2021 CLINICAL DATA:  Bilateral renal masses, status post ablation * Tracking Code: BO * EXAM: CT ABDOMEN WITHOUT AND WITH CONTRAST TECHNIQUE: Multidetector CT imaging of the abdomen was performed following the standard protocol before and following the bolus administration of intravenous contrast. RADIATION DOSE REDUCTION: This exam was performed according to the departmental dose-optimization program which includes automated exposure control, adjustment of the mA and/or kV according to patient size and/or use of iterative reconstruction technique. CONTRAST:  133m OMNIPAQUE IOHEXOL 350 MG/ML SOLN COMPARISON:  09/07/2020 FINDINGS: Lower chest: No acute abnormality.  Small hiatal hernia. Hepatobiliary: No solid liver abnormality is seen. No gallstones, gallbladder wall thickening, or biliary dilatation. Pancreas: Unremarkable. No pancreatic ductal dilatation or surrounding inflammatory changes. Spleen: Normal in size without significant abnormality. Adrenals/Urinary Tract: Adrenal glands are unremarkable. Unchanged bilateral lower pole renal ablation sites without  evidence of recurrent soft tissue or suspicious contrast enhancement (series 6, image 69, 77). Multiple small bilateral simple, benign renal cysts, for which no further follow-up or characterization is required. Multiple small bilateral nonobstructive renal calculi. No hydronephrosis. Stomach/Bowel: Stomach is within normal limits. No evidence of bowel wall thickening, distention, or inflammatory changes. Descending colonic diverticulosis. Vascular/Lymphatic: Aortic atherosclerosis. No enlarged abdominal lymph nodes. Other: No abdominal wall hernia or abnormality. No ascites. Musculoskeletal: No acute or significant osseous findings. IMPRESSION: 1. Unchanged bilateral lower pole renal ablation sites without evidence of recurrent soft tissue or suspicious contrast enhancement. No evidence of lymphadenopathy or metastatic disease in the abdomen or pelvis. 2. Nonobstructive bilateral hydronephrosis.  No hydronephrosis. 3. Small hiatal hernia. Aortic Atherosclerosis (ICD10-I70.0). Electronically Signed   By: ADelanna AhmadiM.D.   On: 09/21/2021 16:30    Labs:  CBC: No results for input(s): WBC, HGB, HCT, PLT in the last 8760 hours.  COAGS: No results for input(s): INR, APTT in the last 8760 hours.  BMP: Recent Labs    09/19/21 1051  CREATININE 1.10    Assessment and Plan:  I spoke with Mr. JBeltranby phone.  We reviewed results from the CT study on 09/19/21.  This demonstrates stable bilateral renal ablation defects without evidence of recurrent neoplasm or abnormal enhancement.    The 10-11 mm exophytic cortical lesion along the lower pole of the left kidney remains present and measures approximately 15 HU on un-enhanced CT, 28 HU on arterial phase and 34 HU on the venous/nephrographic phase. The lesion measured about 7-8 mm in 2018 and 2019. Although this may represent a complex/hemorrhagic cyst, I told Mr. JOguinnthat I do not consider the current CT definitive, and recommended that we eventually  obtain an multiphase MRI for more definitive characterization to determine whether there is truly evidence of enhancement and whether there is evidence of hemorrhage on pre-contrast images. I recommended an MRI some time in the next 3-6 month range.    Electronically Signed: GAzzie Roup5/22/2023, 10:20 AM    I spent a total of 15 Minutes in remote  clinical consultation, greater than 50% of which was counseling/coordinating care post bilateral renal ablation  and for surveillance of a left renal lesion.    Visit type: Audio only (telephone). Audio (no video) only due to patient's lack of internet/smartphone capability. Alternative for in-person consultation at [facility], [address] [place], [place], [place]. This visit type was conducted due to national recommendations for restrictions regarding the COVID-19 Pandemic (e.g. social distancing).  This format is felt to be most appropriate for this patient at this time.  All issues noted in this document were discussed and addressed.

## 2021-10-21 DIAGNOSIS — L720 Epidermal cyst: Secondary | ICD-10-CM | POA: Diagnosis not present

## 2021-11-27 DIAGNOSIS — Z0000 Encounter for general adult medical examination without abnormal findings: Secondary | ICD-10-CM | POA: Diagnosis not present

## 2021-11-27 DIAGNOSIS — G629 Polyneuropathy, unspecified: Secondary | ICD-10-CM | POA: Diagnosis not present

## 2021-11-27 DIAGNOSIS — L121 Cicatricial pemphigoid: Secondary | ICD-10-CM | POA: Diagnosis not present

## 2021-11-27 DIAGNOSIS — E039 Hypothyroidism, unspecified: Secondary | ICD-10-CM | POA: Diagnosis not present

## 2021-11-27 DIAGNOSIS — R911 Solitary pulmonary nodule: Secondary | ICD-10-CM | POA: Diagnosis not present

## 2021-11-27 DIAGNOSIS — Z1211 Encounter for screening for malignant neoplasm of colon: Secondary | ICD-10-CM | POA: Diagnosis not present

## 2021-11-27 DIAGNOSIS — I700 Atherosclerosis of aorta: Secondary | ICD-10-CM | POA: Diagnosis not present

## 2021-11-27 DIAGNOSIS — I77810 Thoracic aortic ectasia: Secondary | ICD-10-CM | POA: Diagnosis not present

## 2021-11-27 DIAGNOSIS — N2889 Other specified disorders of kidney and ureter: Secondary | ICD-10-CM | POA: Diagnosis not present

## 2022-01-02 DIAGNOSIS — Z85528 Personal history of other malignant neoplasm of kidney: Secondary | ICD-10-CM | POA: Diagnosis not present

## 2022-02-18 ENCOUNTER — Other Ambulatory Visit: Payer: Self-pay | Admitting: Interventional Radiology

## 2022-02-18 DIAGNOSIS — N2889 Other specified disorders of kidney and ureter: Secondary | ICD-10-CM

## 2022-03-12 ENCOUNTER — Ambulatory Visit (HOSPITAL_COMMUNITY)
Admission: RE | Admit: 2022-03-12 | Discharge: 2022-03-12 | Disposition: A | Discharge status: Home / Self Care | Payer: Medicare Other | Source: Ambulatory Visit | Attending: Interventional Radiology | Admitting: Interventional Radiology

## 2022-03-12 DIAGNOSIS — N2889 Other specified disorders of kidney and ureter: Secondary | ICD-10-CM | POA: Diagnosis not present

## 2022-03-12 DIAGNOSIS — K862 Cyst of pancreas: Secondary | ICD-10-CM | POA: Diagnosis not present

## 2022-03-12 DIAGNOSIS — N281 Cyst of kidney, acquired: Secondary | ICD-10-CM | POA: Diagnosis not present

## 2022-03-12 DIAGNOSIS — Z85528 Personal history of other malignant neoplasm of kidney: Secondary | ICD-10-CM | POA: Diagnosis not present

## 2022-03-12 MED ORDER — GADOBUTROL 1 MMOL/ML IV SOLN
10.0000 mL | Freq: Once | INTRAVENOUS | Status: AC | PRN
  Administered 2022-03-12: 10 mL via INTRAVENOUS

## 2022-03-14 ENCOUNTER — Encounter: Payer: Self-pay | Admitting: Lab

## 2022-03-14 ENCOUNTER — Ambulatory Visit
Admission: RE | Admit: 2022-03-14 | Discharge: 2022-03-14 | Disposition: A | Discharge status: Home / Self Care | Payer: Medicare Other | Source: Ambulatory Visit | Attending: Interventional Radiology | Admitting: Interventional Radiology

## 2022-03-14 DIAGNOSIS — N2889 Other specified disorders of kidney and ureter: Secondary | ICD-10-CM

## 2022-03-14 DIAGNOSIS — Z9889 Other specified postprocedural states: Secondary | ICD-10-CM | POA: Diagnosis not present

## 2022-03-14 HISTORY — PX: IR RADIOLOGIST EVAL & MGMT: IMG5224

## 2022-03-14 NOTE — Progress Notes (Signed)
Chief Complaint: Patient was consulted remotely today (TeleHealth) for follow-up after prior bilateral renal ablation.   History of Present Illness: Eric Crosby is a 76 y.o. male status post cryoablation of a left renal mass on 06/06/2016 and prior cryoablation of a right renal mass on 01/27/2014. He was last seen on 12/02/2018 at which time ablation sites appeared stable and a small hyperdense cortical lesion at the tip of the lower pole of the left kidney was noted at the site of a previous small cyst which did not demonstrate enhancement and was felt to be consistent with hemorrhage into a pre-existing small cyst.  Unenhanced CT of the abdomen on 06/11/2020 demonstrated potential slight increase in size of this lesion/complex cyst.  A multiphase CT of the abdomen with and without contrast was performed in follow-up on 09/07/2020 demonstrating characteristics of a Bosniak II cyst.  A follow-up CT on 09/19/2021 was not definitive with respect to the 11 mm cortical lesion of the lower pole of the left kidney which demonstrates potential mild enhancement.  A follow-up MRI was therefore recommended and was recently performed on 03/12/2022.  Past Medical History:  Diagnosis Date   Anxiety    hx of   Arthritis    hx of   Asthma    seasonal chronic   Bipolar disorder (HCC)    hx of   Blood dyscrasia    POLYCYTHEMIA- NO MEDICATION OR TREATMENT NEEDED - PT TOLD TO MAKE EVERYONE AWARE HIS RBC CT HIGH - BUT NORMAL FOR HIM   Complication of anesthesia    Difficult intubation    Mask ventilation with an oral airway, grade III view with a MAC 4 and unable to pass bougie, grade II view with a glidescope. Recommend using glidescope for future intubations   Diverticulosis    COLITIS   History of kidney stones    Hx of wheezing    OCCAS WHEEZING - POSS RELATED TO ALLERGIES OR ENVIRONMENTAL ISSUES-HAVE INHALER BUT HAS RARELY NEEDED   Hypothyroidism    OCP (ocular cicatricial pemphigoid)    IN  REMISSION FOR PAST 18 YRS BUT STILL EXPERIENCE EYE PAIN DAILY AND HX OF MULTIPLE SURGERIES   Prostate cancer ([place]) 2011   TX'D WITH SEED IMPLANT   Right renal mass    Sleep apnea    PT USED CPAP FOR SEVERAL YEARS BUT IT MADE HIS EYE PROBLEM WORSE AND HE CAN NOT USE THE MACHINE NOW.    Past Surgical History:  Procedure Laterality Date   colonoscopy     EYE SURGERY     x 20   eyelid surgery     17 SURGICAL PROCEDURES BLATERAL UPPER EYE LIDS   HERNIA REPAIR     UMBILICAL  AND LOWER ABDOMINAL HERNIA REPAIR   IR GENERIC HISTORICAL  03/25/2016   IR RADIOLOGIST EVAL & MGMT 03/25/2016 Aletta Edouard, MD [facility] INTERV RAD   IR GENERIC HISTORICAL  07/11/2014   IR RADIOLOGIST EVAL & MGMT 07/11/2014 Aletta Edouard, MD [facility] INTER[facility] RAD   IR GENERIC HISTORICAL  07/09/2016   IR RADIOLOGIST EVAL & MGMT 07/09/2016 Aletta Edouard, MD [facility] INTER[facility] RAD   IR RADIOLOGIST EVAL & MGMT  09/09/2016   IR RADIOLOGIST EVAL & MGMT  10/08/2017   IR RADIOLOGIST EVAL & MGMT  12/02/2018   IR RADIOLOGIST EVAL & MGMT  06/22/2020   IR RADIOLOGIST EVAL & MGMT  09/12/2020   IR RADIOLOGIST EVAL & MGMT  09/30/2021   NECK SURGERY  CERVICAL FUSION - PT STATES SLIGHT LIMITED ROM   RADIOLOGY WITH ANESTHESIA Left 06/06/2016   Procedure: left renal cryoablation;  Surgeon: Aletta Edouard, MD;  Location: [facility];  Service: Radiology;  Laterality: Left;   RIGHT CATARACT EXTRACTION WITH LENS IMPLANT     RIGHT RETINAL SURGERY FOR BUCKLE     SEED IMPLANT FOR PROSTATE CANCER     WISDOM TOOTH EXTRACTION      Allergies: Adhesive [tape] and Sulfa antibiotics  Medications: Prior to Admission medications   Medication Sig Start Date End Date Taking? Authorizing Provider  albuterol (PROVENTIL HFA;VENTOLIN HFA) 108 (90 BASE) MCG/ACT inhaler Inhale 2 puffs into the lungs every 4 (four) hours as needed for wheezing or shortness of breath.     [provider]  ALPRAZolam Duanne Moron) 0.25 MG tablet Take 0.125 mg by mouth at bedtime as needed  for anxiety.     [provider]  ARTIFICIAL TEARS 0.1-0.3 % SOLN Place 1 drop into both eyes as needed for dry eyes.    [provider]  calcium carbonate (TUMS - DOSED IN MG ELEMENTAL CALCIUM) 500 MG chewable tablet Chew 2 tablets by mouth 2 (two) times daily as needed for indigestion or heartburn.    [provider]  Carboxymethylcell-Hypromellose (GENTEAL OP) Apply 1 application to eye as needed (dry eyes).    [provider]  Ibuprofen (ADVIL) 200 MG CAPS Take 400-600 mg by mouth See admin instructions. Pt takes three tablets in the morning (600 mg) and two tablets at night (479m)    [provider]  levothyroxine (SYNTHROID, LEVOTHROID) 137 MCG tablet Take 137 mcg by mouth daily before breakfast.    [provider]  meclizine (ANTIVERT) 25 MG tablet Take 25 mg by mouth daily as needed for dizziness.     [provider]  Multiple Vitamins-Minerals (ALIVE MENS ENERGY PO) Take 1 tablet by mouth daily.    [provider]  oxyCODONE (ROXICODONE) 5 MG immediate release tablet Take 1 tablet (5 mg total) by mouth every 4 (four) hours as needed for severe pain. 06/11/20   BMaudie Flakes MD  pantoprazole (PROTONIX) 40 MG tablet Take 40 mg by mouth every evening.  07/29/16   [provider]  [facility] 300 MG CAPS Take 300 mg by mouth daily.    [provider]  tamsulosin (FLOMAX) 0.4 MG CAPS capsule Take 1 capsule (0.4 mg total) by mouth daily. 06/11/20   BMaudie Flakes MD     No family history on file.  Social History   Socioeconomic History   Marital status: Married    Spouse name: Not on file   Number of children: Not on file   Years of education: Not on file   Highest education level: Not on file  Occupational History   Not on file  Tobacco Use   Smoking status: Former   Smokeless tobacco: Never   Tobacco comments:    26 years ago  Substance and Sexual Activity   Alcohol use: No   Drug use:  No   Sexual activity: Yes  Other Topics Concern   Not on file  Social History Narrative   Not on file   Social Determinants of Health   Financial Resource Strain: Not on file  Food Insecurity: Not on file  Transportation Needs: Not on file  Physical Activity: Not on file  Stress: Not on file  Social Connections: Not on file    ECOG Status: 0 - Asymptomatic  Review of Systems  Constitutional: Negative.   Respiratory: Negative.    Cardiovascular: Negative.   Gastrointestinal: Negative.   Genitourinary: Negative.   Musculoskeletal: Negative.   Neurological: Negative.     Review of Systems: A 12 point ROS discussed and pertinent positives are indicated in the HPI above.  All other systems are negative.   Physical Exam No direct physical exam was performed (except for noted visual exam findings with Video Visits).   Vital Signs: There were no vitals taken for this visit.  Imaging: MR ABDOMEN WWO CONTRAST  Result Date: 03/14/2022 CLINICAL DATA:  Status post ablation for renal cell carcinoma. 10-11 mm exophytic cortical lesion lower pole left kidney with indeterminate CT imaging characteristics for enhancement. EXAM: MRI ABDOMEN WITHOUT AND WITH CONTRAST TECHNIQUE: Multiplanar multisequence MR imaging of the abdomen was performed both before and after the administration of intravenous contrast. CONTRAST:  80m GADAVIST GADOBUTROL 1 MMOL/ML IV SOLN COMPARISON:  CT scan 09/19/2021 FINDINGS: Lower chest: Unremarkable. Hepatobiliary: No suspicious focal abnormality within the liver parenchyma. There is no evidence for gallstones, gallbladder wall thickening, or pericholecystic fluid. No intrahepatic or extrahepatic biliary dilation. Pancreas: 13 mm simple cyst identified along the anterior head of pancreas (axial T2 haste image 23/5). This is been present on prior CT scans but is much more subtle on CT imaging. Spleen:  No splenomegaly. No focal mass lesion. Adrenals/Urinary Tract: No  adrenal nodule or mass. Stable ablation defects noted lower pole each kidney. Scattered tiny simple cysts are evident bilaterally. Small exophytic lesion posterior lower pole left kidney measures up to 12 mm MRI today. This lesion is homogeneous and well defined. T1 shortening on precontrast imaging shows increased signal in the lesion, just above background renal parenchymal levels. Imaging after IV contrast administration shows potential enhancement in the lesion although assessment is motion degraded. Subtraction imaging is suggestive of non enhancement although due to motion artifact there is misregistration of this tiny lesion. Stomach/Bowel: Stomach is nondistended. No small bowel or colonic dilatation within the visualized abdomen. Diverticular changes noted left colon. Vascular/Lymphatic: No abdominal aortic aneurysm. No abdominal lymphadenopathy. Other:  No intraperitoneal free fluid. Musculoskeletal: No focal suspicious marrow enhancement within the visualized bony anatomy. IMPRESSION: 1. Stable appearance of bilateral renal ablation defects. 2. Tiny exophytic lesion of concern posterior lower pole left kidney is most suggestive of a complex/hemorrhagic cyst although there is some motion artifact on postcontrast imaging and misregistration on subtraction imaging which hinders assessment. Continued surveillance warranted. 3. Additional simple cyst noted in both kidneys with other tiny T2 hyperintense lesions bilaterally that are technically too small to characterize. Likely benign, these can be reassessed at the time of follow-up. Electronically Signed   By: EMisty StanleyM.D.   On: 03/14/2022 07:39    Labs:  CBC: No results for input(s): "WBC", "HGB", "HCT", "PLT" in the last 8760 hours.  COAGS: No results for input(s): "INR", "APTT" in the last 8760 hours.  BMP: Recent Labs    09/19/21 1051  CREATININE 1.10     Assessment and Plan:  I spoke with Mr. JFlessnerover the phone.  We reviewed  findings from the MRI of the abdomen on 03/12/2022.  This demonstrates a roughly 12 mm exophytic posterior lower pole cortical lesion of the left kidney demonstrating potential mild enhancement but also some misregistration motion artifact.  The lesion was felt to most likely represent a complex/hemorrhagic cyst but continued surveillance was recommended.  Bilateral ablation defects remain stable without evidence of recurrence.  I recommended a follow-up MRI in 1 year.  Eric Crosby is agreeable.  I will follow-up with him after the MRI.   Electronically Signed: Azzie Roup 03/14/2022, 9:56 AM    I spent a total of 10 Minutes in remote  clinical consultation, greater than 50% of which was counseling/coordinating care for follow-up of a left renal lesion.    Visit type: Audio only (telephone). Audio (no video) only due to patient's lack of internet/smartphone capability. Alternative for in-person consultation at [facility], [address] [place], [place], [place]. This visit type was conducted due to national recommendations for restrictions regarding the COVID-19 Pandemic (e.g. social distancing).  This format is felt to be most appropriate for this patient at this time.  All issues noted in this document were discussed and addressed.

## 2022-03-25 DIAGNOSIS — H1789 Other corneal scars and opacities: Secondary | ICD-10-CM | POA: Diagnosis not present

## 2022-03-25 DIAGNOSIS — H40013 Open angle with borderline findings, low risk, bilateral: Secondary | ICD-10-CM | POA: Diagnosis not present

## 2022-04-08 DIAGNOSIS — M5416 Radiculopathy, lumbar region: Secondary | ICD-10-CM | POA: Diagnosis not present

## 2022-04-22 DIAGNOSIS — D2272 Melanocytic nevi of left lower limb, including hip: Secondary | ICD-10-CM | POA: Diagnosis not present

## 2022-04-22 DIAGNOSIS — L720 Epidermal cyst: Secondary | ICD-10-CM | POA: Diagnosis not present

## 2022-04-22 DIAGNOSIS — D2261 Melanocytic nevi of right upper limb, including shoulder: Secondary | ICD-10-CM | POA: Diagnosis not present

## 2022-04-22 DIAGNOSIS — L814 Other melanin hyperpigmentation: Secondary | ICD-10-CM | POA: Diagnosis not present

## 2022-04-22 DIAGNOSIS — D2221 Melanocytic nevi of right ear and external auricular canal: Secondary | ICD-10-CM | POA: Diagnosis not present

## 2022-04-22 DIAGNOSIS — D2262 Melanocytic nevi of left upper limb, including shoulder: Secondary | ICD-10-CM | POA: Diagnosis not present

## 2022-04-22 DIAGNOSIS — D2271 Melanocytic nevi of right lower limb, including hip: Secondary | ICD-10-CM | POA: Diagnosis not present

## 2022-04-22 DIAGNOSIS — L821 Other seborrheic keratosis: Secondary | ICD-10-CM | POA: Diagnosis not present

## 2022-04-22 DIAGNOSIS — D1801 Hemangioma of skin and subcutaneous tissue: Secondary | ICD-10-CM | POA: Diagnosis not present

## 2022-04-22 DIAGNOSIS — D225 Melanocytic nevi of trunk: Secondary | ICD-10-CM | POA: Diagnosis not present

## 2022-04-24 DIAGNOSIS — M5416 Radiculopathy, lumbar region: Secondary | ICD-10-CM | POA: Diagnosis not present

## 2022-05-11 DIAGNOSIS — R6883 Chills (without fever): Secondary | ICD-10-CM | POA: Diagnosis not present

## 2022-05-11 DIAGNOSIS — R197 Diarrhea, unspecified: Secondary | ICD-10-CM | POA: Diagnosis not present

## 2022-05-11 DIAGNOSIS — U071 COVID-19: Secondary | ICD-10-CM | POA: Diagnosis not present

## 2022-05-11 DIAGNOSIS — R52 Pain, unspecified: Secondary | ICD-10-CM | POA: Diagnosis not present

## 2022-05-27 DIAGNOSIS — Z79899 Other long term (current) drug therapy: Secondary | ICD-10-CM | POA: Diagnosis not present

## 2022-05-27 DIAGNOSIS — Z131 Encounter for screening for diabetes mellitus: Secondary | ICD-10-CM | POA: Diagnosis not present

## 2022-05-27 DIAGNOSIS — Z1211 Encounter for screening for malignant neoplasm of colon: Secondary | ICD-10-CM | POA: Diagnosis not present

## 2022-05-27 DIAGNOSIS — E039 Hypothyroidism, unspecified: Secondary | ICD-10-CM | POA: Diagnosis not present

## 2022-05-27 DIAGNOSIS — R202 Paresthesia of skin: Secondary | ICD-10-CM | POA: Diagnosis not present

## 2022-05-27 DIAGNOSIS — Z0000 Encounter for general adult medical examination without abnormal findings: Secondary | ICD-10-CM | POA: Diagnosis not present

## 2022-05-27 DIAGNOSIS — E559 Vitamin D deficiency, unspecified: Secondary | ICD-10-CM | POA: Diagnosis not present

## 2022-05-27 DIAGNOSIS — Z113 Encounter for screening for infections with a predominantly sexual mode of transmission: Secondary | ICD-10-CM | POA: Diagnosis not present

## 2022-05-27 DIAGNOSIS — Z23 Encounter for immunization: Secondary | ICD-10-CM | POA: Diagnosis not present

## 2022-05-27 DIAGNOSIS — E7800 Pure hypercholesterolemia, unspecified: Secondary | ICD-10-CM | POA: Diagnosis not present

## 2022-05-27 DIAGNOSIS — F411 Generalized anxiety disorder: Secondary | ICD-10-CM | POA: Diagnosis not present

## 2022-06-04 DIAGNOSIS — Z86010 Personal history of colonic polyps: Secondary | ICD-10-CM | POA: Diagnosis not present

## 2022-06-17 DIAGNOSIS — Z1211 Encounter for screening for malignant neoplasm of colon: Secondary | ICD-10-CM | POA: Diagnosis not present

## 2022-06-17 DIAGNOSIS — K635 Polyp of colon: Secondary | ICD-10-CM | POA: Diagnosis not present

## 2022-06-21 DIAGNOSIS — K635 Polyp of colon: Secondary | ICD-10-CM | POA: Diagnosis not present

## 2022-06-27 DIAGNOSIS — R3915 Urgency of urination: Secondary | ICD-10-CM | POA: Diagnosis not present

## 2022-06-27 DIAGNOSIS — R3912 Poor urinary stream: Secondary | ICD-10-CM | POA: Diagnosis not present

## 2022-06-30 DIAGNOSIS — N35011 Post-traumatic bulbous urethral stricture: Secondary | ICD-10-CM | POA: Diagnosis not present

## 2022-06-30 DIAGNOSIS — Z8546 Personal history of malignant neoplasm of prostate: Secondary | ICD-10-CM | POA: Diagnosis not present

## 2022-06-30 DIAGNOSIS — N210 Calculus in bladder: Secondary | ICD-10-CM | POA: Diagnosis not present

## 2022-07-03 ENCOUNTER — Other Ambulatory Visit: Payer: Self-pay | Admitting: Urology

## 2022-07-06 NOTE — Progress Notes (Addendum)
COVID Vaccine received:  []  No [x]  Yes Date of any COVID positive Test in last 90 days: In January, no residual effects per patient.  PCP - Dr. Carmel Sacramento at [facility] on [address].  Cardiologist - none  Chest x-ray -  EKG - 06-04-2016 epic    No reason to repeat Stress Test -  ECHO -  Cardiac Cath -   PCR screen: []  Ordered & Completed                      []   No Order but Needs PROFEND                      [x]   N/A for this surgery  Surgery Plan:  []  Ambulatory                            [x]  Outpatient in bed                            []  Admit  Anesthesia:    [x]  General  []  Spinal                           []   Choice []   MAC  Bowel Prep - [x]  No  []   Yes _____________  Pacemaker / ICD device [x]  No []  Yes        Device order form faxed [x]  No    []   Yes      Faxed to:  Spinal Cord Stimulator:[x]  No []  Yes      (Remind patient to bring remote DOS) Other Implants:   History of Sleep Apnea? []  No [x]  Yes  CPAP used?- [x]  No []  Yes  Doesn't help  Does the patient monitor blood sugar? []  No []  Yes  [x]  N/A  Blood Thinner / Instructions:  none Aspirin Instructions:  none  ERAS Protocol Ordered: [x]  No  []  Yes Patient is to be NPO after: Midnight prior  Activity level: Patient can climb a flight of stairs without difficulty; [x]  No CP  []  No SOB, but would have ______   Patient can / can not perform ADLs without assistance.   Anesthesia review: Difficult Intubation,  hx ACDF (C5-6 and C6-7) 08-18-2003 by Dr. Venetia Maxon, OSA (No CPAP), asthma, Polycythemia, recent cryoablation renal mass in IR on 03-14-2022, Bipolar, hx prostate ca. Has had several eye surgeries.  Patient denies shortness of breath, fever, cough and chest pain at PAT appointment.  Patient verbalized understanding and agreement to the Pre-Surgical Instructions that were given to them at this PAT appointment. Patient was also educated of the need to review these PAT instructions again prior to his/her  surgery.I reviewed the appropriate phone numbers to call if they have any and questions or concerns.

## 2022-07-06 NOTE — Patient Instructions (Addendum)
SURGICAL WAITING ROOM VISITATION Patients having surgery or a procedure may have no more than 2 support people in the waiting area - these visitors may rotate in the visitor waiting room.   Due to an increase in RSV and influenza rates and associated hospitalizations, children ages 56 and under may not visit patients in [place]. If the patient needs to stay at the hospital during part of their recovery, the visitor guidelines for inpatient rooms apply.  PRE-OP VISITATION  Pre-op nurse will coordinate an appropriate time for 1 support person to accompany the patient in pre-op.  This support person may not rotate.  This visitor will be contacted when the time is appropriate for the visitor to come back in the pre-op area.  Please refer to the [facility] website for the visitor guidelines for Inpatients (after your surgery is over and you are in a regular room).  You are not required to quarantine at this time prior to your surgery. However, you must do this: Hand Hygiene often Do NOT share personal items Notify your provider if you are in close contact with someone who has COVID or you develop fever 100.4 or greater, new onset of sneezing, cough, sore throat, shortness of breath or body aches.  If you test positive for Covid or have been in contact with anyone that has tested positive in the last 10 days please notify you surgeon.    Your procedure is scheduled on: Tuesday July 15, 2022   Report to [facility] Main Entrance: [place] entrance where the [employer] is available.   Report to admitting at: 06:45  AM  +++++Call this number if you have any questions or problems the morning of surgery [phone]  DO NOT EAT OR DRINK ANYTHING AFTER MIDNIGHT THE NIGHT PRIOR TO YOUR SURGERY / PROCEDURE.   FOLLOW BOWEL PREP AND ANY ADDITIONAL PRE OP INSTRUCTIONS YOU RECEIVED FROM YOUR SURGEON'S OFFICE!!!   Oral Hygiene is also important to reduce your risk of infection.         Remember - BRUSH YOUR TEETH THE MORNING OF SURGERY WITH YOUR REGULAR TOOTHPASTE  Do NOT smoke after Midnight the night before surgery.  Take ONLY these medicines the morning of surgery with A SIP OF WATER: levothyroxine, tamsulosin (Flomax), gabapentin,  You may use your Eye drops if needed and you may take Tylenol if needed for pain.                   You may not have any metal on your body including jewelry, and body piercing  Do not wear lotions, powders, cologne, or deodorant  Men may shave face and neck.  Contacts, Hearing Aids, dentures or bridgework may not be worn into surgery.   You may bring a small overnight bag with you on the day of surgery, only pack items that are not valuable. [facility] IS NOT RESPONSIBLE   FOR VALUABLES THAT ARE LOST OR STOLEN.   Do not bring your home medications to the hospital. The Pharmacy will dispense medications listed on your medication list to you during your admission in the Hospital.  Special Instructions: Bring a copy of your healthcare power of attorney and living will documents the day of surgery, if you wish to have them scanned into your [facility] Records- EPIC  Please read over the following fact sheets you were given: IF YOU HAVE QUESTIONS ABOUT YOUR PRE-OP INSTRUCTIONS, [place] [id number]  Zigmund Daniel)   [place] -  Preparing for Surgery Before surgery, you can play an important role.  Because skin is not sterile, your skin needs to be as free of germs as possible.  You can reduce the number of germs on your skin by washing with CHG (chlorahexidine gluconate) soap before surgery.  CHG is an antiseptic cleaner which kills germs and bonds with the skin to continue killing germs even after washing. Please DO NOT use if you have an allergy to CHG or antibacterial soaps.  If your skin becomes reddened/irritated stop using the CHG and inform your nurse when you arrive at Short Stay. Do not shave (including legs and underarms)  for at least 48 hours prior to the first CHG shower.  You may shave your face/neck.  Please follow these instructions carefully:  1.  Shower with CHG Soap the night before surgery and the  morning of surgery.  2.  If you choose to wash your hair, wash your hair first as usual with your normal  shampoo.  3.  After you shampoo, rinse your hair and body thoroughly to remove the shampoo.                             4.  Use CHG as you would any other liquid soap.  You can apply chg directly to the skin and wash.  Gently with a scrungie or clean washcloth.  5.  Apply the CHG Soap to your body ONLY FROM THE NECK DOWN.   Do not use on face/ open                           Wound or open sores. Avoid contact with eyes, ears mouth and genitals (private parts).                       Wash face,  Genitals (private parts) with your normal soap.             6.  Wash thoroughly, paying special attention to the area where your  surgery  will be performed.  7.  Thoroughly rinse your body with warm water from the neck down.  8.  DO NOT shower/wash with your normal soap after using and rinsing off the CHG Soap.            9.  Pat yourself dry with a clean towel.            10.  Wear clean pajamas.            11.  Place clean sheets on your bed the night of your first shower and do not  sleep with pets.  ON THE DAY OF SURGERY : Do not apply any lotions/deodorants the morning of surgery.  Please wear clean clothes to the hospital/surgery center.    FAILURE TO FOLLOW THESE INSTRUCTIONS MAY RESULT IN THE CANCELLATION OF YOUR SURGERY  PATIENT SIGNATURE_________________________________  NURSE SIGNATURE__________________________________  ________________________________________________________________________

## 2022-07-08 ENCOUNTER — Other Ambulatory Visit: Payer: Self-pay

## 2022-07-08 ENCOUNTER — Encounter (HOSPITAL_COMMUNITY): Payer: Self-pay

## 2022-07-08 ENCOUNTER — Encounter (HOSPITAL_COMMUNITY)
Admission: RE | Admit: 2022-07-08 | Discharge: 2022-07-08 | Disposition: A | Discharge status: Home / Self Care | Payer: PPO | Source: Ambulatory Visit | Attending: Urology | Admitting: Urology

## 2022-07-08 VITALS — BP 132/86 | HR 80 | Temp 97.8°F | Resp 16 | Ht 74.0 in | Wt 211.0 lb

## 2022-07-08 DIAGNOSIS — Z01812 Encounter for preprocedural laboratory examination: Secondary | ICD-10-CM | POA: Diagnosis not present

## 2022-07-08 DIAGNOSIS — D751 Secondary polycythemia: Secondary | ICD-10-CM | POA: Diagnosis not present

## 2022-07-08 DIAGNOSIS — N2889 Other specified disorders of kidney and ureter: Secondary | ICD-10-CM | POA: Insufficient documentation

## 2022-07-08 DIAGNOSIS — N35919 Unspecified urethral stricture, male, unspecified site: Secondary | ICD-10-CM | POA: Insufficient documentation

## 2022-07-08 DIAGNOSIS — Z87891 Personal history of nicotine dependence: Secondary | ICD-10-CM | POA: Insufficient documentation

## 2022-07-08 DIAGNOSIS — N210 Calculus in bladder: Secondary | ICD-10-CM | POA: Insufficient documentation

## 2022-07-08 DIAGNOSIS — J45909 Unspecified asthma, uncomplicated: Secondary | ICD-10-CM | POA: Diagnosis not present

## 2022-07-08 DIAGNOSIS — Z01818 Encounter for other preprocedural examination: Secondary | ICD-10-CM

## 2022-07-08 HISTORY — DX: Headache, unspecified: R51.9

## 2022-07-08 LAB — CBC
HCT: 47.5 % (ref 39.0–52.0)
Hemoglobin: 15.7 g/dL (ref 13.0–17.0)
MCH: 32.2 pg (ref 26.0–34.0)
MCHC: 33.1 g/dL (ref 30.0–36.0)
MCV: 97.5 fL (ref 80.0–100.0)
Platelets: 345 10*3/uL (ref 150–400)
RBC: 4.87 MIL/uL (ref 4.22–5.81)
RDW: 14.3 % (ref 11.5–15.5)
WBC: 7.8 10*3/uL (ref 4.0–10.5)
nRBC: 0 % (ref 0.0–0.2)

## 2022-07-08 LAB — BASIC METABOLIC PANEL
Anion gap: 7 (ref 5–15)
BUN: 16 mg/dL (ref 8–23)
CO2: 24 mmol/L (ref 22–32)
Calcium: 10.2 mg/dL (ref 8.9–10.3)
Chloride: 109 mmol/L (ref 98–111)
Creatinine, Ser: 1.05 mg/dL (ref 0.61–1.24)
GFR, Estimated: >60 mL/min (ref >60)
Glucose, Bld: 94 mg/dL (ref 70–99)
Potassium: 3.8 mmol/L (ref 3.5–5.1)
Sodium: 140 mmol/L (ref 135–145)

## 2022-07-09 NOTE — Progress Notes (Signed)
Anesthesia Chart Review   Case: [id number] Date/Time: 07/15/22 0845   Procedure: CYSTOSCOPY WITH LITHOLAPAXY AND URETHRAL DILATION - 29 MINS   Anesthesia type: General   Pre-op diagnosis:      BLADDER STONE     URETHRAL STRICTURE   [facility] / [facility]   Surgeons: Ceasar Mons, MD       DISCUSSION:77 y.o. former smoker with h/o sleep apnea, asthma, right renal mass, bladder stone, urethral stricture scheduled for above procedure 07/15/2022 with Dr. Harrell Gave Lovena Neighbours.   H/o difficult intubation note, "Mask ventilation with an oral airway, grade III view with a MAC 4 and unable to pass bougie, grade II view with a glidescope. Recommend using glidescope for future intubations "  Successful intubation with Glidescope 03/30/2017.  Blade: Macintosh ETT size: 7.5 mm Cuffed: yes View (Cormack Lehane grade): grade I - visualization of entire laryngeal aperture Placement verified by: chest auscultation/breath sounds equal bilaterally  Cuff volume (mL): 7 Measured from: gums ETT to gums (cm): 22   Per anesthesia notes 06/06/2016,  "Laryngoscope Size: 3 and Glidescope Grade View: Grade I Tube type: Oral Tube size: 8.0 mm Number of attempts: 1 Airway Equipment and Method: Stylet Placement Confirmation: ETT inserted through vocal cords under direct vision,  positive ETCO2,  CO2 detector and breath sounds checked- equal and bilateral Secured at: 22 cm Tube secured with: Tape Dental Injury: Teeth and Oropharynx as per pre-operative assessment  Comments: Small abrasion to lip, lubricant applied" VS: BP 132/86 Comment: right arm sitting  Pulse 80   Temp 36.6 C (Oral)   Resp 16   Ht '6\' 2"'$  (1.88 m)   Wt 95.7 kg   SpO2 98%   BMI 27.09 kg/m   PROVIDERS: Emelia Loron, NP is PCP    LABS: Labs reviewed: Acceptable for surgery. (all labs ordered are listed, but only abnormal results are displayed)  Labs Reviewed  BASIC METABOLIC PANEL  CBC      IMAGES:   EKG:   CV:  Past Medical History:  Diagnosis Date   Anxiety    hx of   Arthritis    hx of   Asthma    seasonal chronic   Bipolar disorder (HCC)    hx of   Blood dyscrasia    POLYCYTHEMIA- NO MEDICATION OR TREATMENT NEEDED - PT TOLD TO MAKE EVERYONE AWARE HIS RBC CT HIGH - BUT NORMAL FOR HIM   Complication of anesthesia    Difficult intubation    Mask ventilation with an oral airway, grade III view with a MAC 4 and unable to pass bougie, grade II view with a glidescope. Recommend using glidescope for future intubations   Diverticulosis    COLITIS   Headache    migraines with aura   History of kidney stones    Hx of wheezing    OCCAS WHEEZING - POSS RELATED TO ALLERGIES OR ENVIRONMENTAL ISSUES-HAVE INHALER BUT HAS RARELY NEEDED   Hypothyroidism    OCP (ocular cicatricial pemphigoid)    IN REMISSION FOR PAST 18 YRS BUT STILL EXPERIENCE EYE PAIN DAILY AND HX OF MULTIPLE SURGERIES   Prostate cancer ([place]) 2011   TX'D WITH SEED IMPLANT   Right renal mass    Sleep apnea    PT USED CPAP FOR SEVERAL YEARS BUT IT MADE HIS EYE PROBLEM WORSE AND HE CAN NOT USE THE MACHINE NOW.    Past Surgical History:  Procedure Laterality Date   colonoscopy     EYE SURGERY  x 20   eyelid surgery     17 SURGICAL PROCEDURES BLATERAL UPPER EYE LIDS   HERNIA REPAIR     UMBILICAL  AND LOWER ABDOMINAL HERNIA REPAIR   IR GENERIC HISTORICAL  03/25/2016   IR RADIOLOGIST EVAL & MGMT 03/25/2016 Aletta Edouard, MD [facility] INTERV RAD   IR GENERIC HISTORICAL  07/11/2014   IR RADIOLOGIST EVAL & MGMT 07/11/2014 Aletta Edouard, MD [facility] INTERV RAD   IR GENERIC HISTORICAL  07/09/2016   IR RADIOLOGIST EVAL & MGMT 07/09/2016 Aletta Edouard, MD [facility] INTERV RAD   IR RADIOLOGIST EVAL & MGMT  09/09/2016   IR RADIOLOGIST EVAL & MGMT  10/08/2017   IR RADIOLOGIST EVAL & MGMT  12/02/2018   IR RADIOLOGIST EVAL & MGMT  06/22/2020   IR RADIOLOGIST EVAL & MGMT  09/12/2020   IR RADIOLOGIST EVAL &  MGMT  09/30/2021   IR RADIOLOGIST EVAL & MGMT  03/14/2022   NECK SURGERY  08/18/2003   ACDF  C5-6 C6-7  by Dr. Vertell Limber   RADIOLOGY WITH ANESTHESIA Left 06/06/2016   Procedure: left renal cryoablation;  Surgeon: Aletta Edouard, MD;  Location: [facility];  Service: Radiology;  Laterality: Left;   RIGHT CATARACT EXTRACTION WITH LENS IMPLANT     RIGHT RETINAL SURGERY FOR BUCKLE     SEED IMPLANT FOR PROSTATE CANCER     TONSILLECTOMY     WISDOM TOOTH EXTRACTION      MEDICATIONS:  acetaminophen (TYLENOL) 650 MG CR tablet   albuterol (PROVENTIL HFA;VENTOLIN HFA) 108 (90 BASE) MCG/ACT inhaler   ALPRAZolam (XANAX) 0.25 MG tablet   ARTIFICIAL TEARS 0.1-0.3 % SOLN   calcium carbonate (TUMS - DOSED IN MG ELEMENTAL CALCIUM) 500 MG chewable tablet   CIPROFLOXACIN HCL PO   gabapentin (NEURONTIN) 300 MG capsule   levothyroxine (SYNTHROID) 125 MCG tablet   loratadine (CLARITIN) 10 MG tablet   meclizine (ANTIVERT) 25 MG tablet   Misc Natural Products (SAW [place]METTO) CAPS   Multiple Vitamins-Minerals (ALIVE MENS ENERGY PO)   OVER THE COUNTER MEDICATION   oxyCODONE (ROXICODONE) 5 MG immediate release tablet   pantoprazole (PROTONIX) 40 MG tablet   Polyethyl Glycol-Propyl Glycol (SYSTANE) 0.4-0.3 % GEL ophthalmic gel   tamsulosin (FLOMAX) 0.4 MG CAPS capsule   No current facility-administered medications for this encounter.     Eric Felix Ward, PA-C [facility]-Surgical Testing [phone]

## 2022-07-09 NOTE — Anesthesia Preprocedure Evaluation (Addendum)
Anesthesia Evaluation  Patient identified by MRN, date of birth, ID band Patient awake    Reviewed: Allergy & Precautions, NPO status , Patient's Chart, lab work & pertinent test results  History of Anesthesia Complications (+) DIFFICULT AIRWAY and history of anesthetic complications  Airway Mallampati: II  TM Distance: >3 FB Neck ROM: Full    Dental no notable dental hx.    Pulmonary asthma , sleep apnea , former smoker   Pulmonary exam normal breath sounds clear to auscultation       Cardiovascular negative cardio ROS Normal cardiovascular exam Rhythm:Regular Rate:Normal     Neuro/Psych  Headaches PSYCHIATRIC DISORDERS Anxiety  Bipolar Disorder      GI/Hepatic negative GI ROS, Neg liver ROS,,,  Endo/Other  Hypothyroidism    Renal/GU negative Renal ROS  negative genitourinary   Musculoskeletal  (+) Arthritis ,    Abdominal   Peds negative pediatric ROS (+)  Hematology  (+) Blood dyscrasia   Anesthesia Other Findings   Reproductive/Obstetrics negative OB ROS                             Anesthesia Physical Anesthesia Plan  ASA: 3  Anesthesia Plan: General   Post-op Pain Management: Minimal or no pain anticipated   Induction: Intravenous  PONV Risk Score and Plan: 2 and Ondansetron, Dexamethasone and Treatment may vary due to age or medical condition  Airway Management Planned: Oral ETT and Video Laryngoscope Planned  Additional Equipment:   Intra-op Plan:   Post-operative Plan: Extubation in OR  Informed Consent: I have reviewed the patients History and Physical, chart, labs and discussed the procedure including the risks, benefits and alternatives for the proposed anesthesia with the patient or authorized representative who has indicated his/her understanding and acceptance.     Dental advisory given  Plan Discussed with: CRNA and Surgeon  Anesthesia Plan Comments:  (See PAT note 07/08/2022  DISCUSSION:77 y.o. former smoker with h/o sleep apnea, asthma, right renal mass, bladder stone, urethral stricture scheduled for above procedure 07/15/2022 with Dr. Harrell Gave Eric Crosby.    H/o difficult intubation note, "Mask ventilation with an oral airway, grade III view with a MAC 4 and unable to pass bougie, grade II view with a glidescope. Recommend using glidescope for future intubations "   Successful intubation with Glidescope 03/30/2017.  Blade: Macintosh ETT size: 7.5 mm Cuffed: yes View (Cormack Lehane grade): grade I - visualization of entire laryngeal aperture Placement verified by: chest auscultation/breath sounds equal bilaterally  Cuff volume (mL): 7 Measured from: gums ETT to gums (cm): 22    Per anesthesia notes 06/06/2016,  "Laryngoscope Size: 3 and Glidescope Grade View: Grade I Tube type: Oral Tube size: 8.0 mm Number of attempts: 1 Airway Equipment and Method: Stylet Placement Confirmation: ETT inserted through vocal cords under direct vision,  positive ETCO2,  CO2 detector and breath sounds checked- equal and bilateral Secured at: 22 cm Tube secured with: Tape Dental Injury: Teeth and Oropharynx as per pre-operative assessment  )        Anesthesia Quick Evaluation

## 2022-07-15 ENCOUNTER — Ambulatory Visit (HOSPITAL_COMMUNITY): Payer: PPO | Admitting: Physician Assistant

## 2022-07-15 ENCOUNTER — Encounter (HOSPITAL_COMMUNITY): Payer: Self-pay | Admitting: Urology

## 2022-07-15 ENCOUNTER — Ambulatory Visit (HOSPITAL_COMMUNITY)
Admission: RE | Admit: 2022-07-15 | Discharge: 2022-07-15 | Disposition: A | Discharge status: Home / Self Care | Payer: PPO | Source: Ambulatory Visit | Attending: Urology | Admitting: Urology

## 2022-07-15 ENCOUNTER — Encounter (HOSPITAL_COMMUNITY)
Admission: RE | Disposition: A | Discharge status: Home / Self Care | Payer: Self-pay | Source: Ambulatory Visit | Attending: Urology

## 2022-07-15 ENCOUNTER — Other Ambulatory Visit: Payer: Self-pay

## 2022-07-15 ENCOUNTER — Ambulatory Visit (HOSPITAL_BASED_OUTPATIENT_CLINIC_OR_DEPARTMENT_OTHER): Payer: PPO | Admitting: Certified Registered"

## 2022-07-15 DIAGNOSIS — N3289 Other specified disorders of bladder: Secondary | ICD-10-CM | POA: Diagnosis not present

## 2022-07-15 DIAGNOSIS — Z8546 Personal history of malignant neoplasm of prostate: Secondary | ICD-10-CM | POA: Insufficient documentation

## 2022-07-15 DIAGNOSIS — N35913 Unspecified membranous urethral stricture, male: Secondary | ICD-10-CM | POA: Insufficient documentation

## 2022-07-15 DIAGNOSIS — N210 Calculus in bladder: Secondary | ICD-10-CM

## 2022-07-15 DIAGNOSIS — J45909 Unspecified asthma, uncomplicated: Secondary | ICD-10-CM | POA: Diagnosis not present

## 2022-07-15 DIAGNOSIS — E039 Hypothyroidism, unspecified: Secondary | ICD-10-CM | POA: Insufficient documentation

## 2022-07-15 DIAGNOSIS — F319 Bipolar disorder, unspecified: Secondary | ICD-10-CM | POA: Diagnosis not present

## 2022-07-15 DIAGNOSIS — Z87891 Personal history of nicotine dependence: Secondary | ICD-10-CM | POA: Diagnosis not present

## 2022-07-15 DIAGNOSIS — N323 Diverticulum of bladder: Secondary | ICD-10-CM | POA: Diagnosis not present

## 2022-07-15 DIAGNOSIS — F419 Anxiety disorder, unspecified: Secondary | ICD-10-CM | POA: Insufficient documentation

## 2022-07-15 DIAGNOSIS — G4730 Sleep apnea, unspecified: Secondary | ICD-10-CM | POA: Insufficient documentation

## 2022-07-15 DIAGNOSIS — N35911 Unspecified urethral stricture, male, meatal: Secondary | ICD-10-CM | POA: Diagnosis not present

## 2022-07-15 HISTORY — PX: CYSTOSCOPY WITH LITHOLAPAXY: SHX1425

## 2022-07-15 SURGERY — CYSTOSCOPY, WITH BLADDER CALCULUS LITHOLAPAXY
Anesthesia: General

## 2022-07-15 MED ORDER — ACETAMINOPHEN 325 MG PO TABS
ORAL_TABLET | ORAL | Status: AC
  Filled 2022-07-15: qty 2

## 2022-07-15 MED ORDER — MEPERIDINE HCL 50 MG/ML IJ SOLN: 6.2500 mg | INTRAMUSCULAR | Status: DC | PRN

## 2022-07-15 MED ORDER — OXYCODONE HCL 5 MG/5ML PO SOLN: 5.0000 mg | Freq: Once | ORAL | Status: AC | PRN

## 2022-07-15 MED ORDER — PHENYLEPHRINE 80 MCG/ML (10ML) SYRINGE FOR IV PUSH (FOR BLOOD PRESSURE SUPPORT)
PREFILLED_SYRINGE | INTRAVENOUS | Status: DC | PRN
  Administered 2022-07-15 (×5): 80 ug via INTRAVENOUS

## 2022-07-15 MED ORDER — ACETAMINOPHEN 325 MG PO TABS
325.0000 mg | ORAL_TABLET | ORAL | Status: DC | PRN
  Administered 2022-07-15: 650 mg via ORAL

## 2022-07-15 MED ORDER — LACTATED RINGERS IV SOLN: INTRAVENOUS | Status: DC

## 2022-07-15 MED ORDER — OXYCODONE HCL 5 MG PO TABS
5.0000 mg | ORAL_TABLET | Freq: Once | ORAL | Status: AC | PRN
  Administered 2022-07-15: 5 mg via ORAL

## 2022-07-15 MED ORDER — CIPROFLOXACIN HCL 500 MG PO TABS: 500.0000 mg | ORAL_TABLET | Freq: Two times a day (BID) | ORAL | 0 refills | Status: AC

## 2022-07-15 MED ORDER — DEXAMETHASONE SODIUM PHOSPHATE 10 MG/ML IJ SOLN
INTRAMUSCULAR | Status: AC
  Filled 2022-07-15: qty 1

## 2022-07-15 MED ORDER — SODIUM CHLORIDE 0.9 % IR SOLN
Status: DC | PRN
  Administered 2022-07-15: 1000 mL

## 2022-07-15 MED ORDER — PROPOFOL 10 MG/ML IV BOLUS
INTRAVENOUS | Status: DC | PRN
  Administered 2022-07-15: 170 mg via INTRAVENOUS
  Administered 2022-07-15: 30 mg via INTRAVENOUS

## 2022-07-15 MED ORDER — ONDANSETRON HCL 4 MG/2ML IJ SOLN: 4.0000 mg | Freq: Once | INTRAMUSCULAR | Status: DC | PRN

## 2022-07-15 MED ORDER — PROPOFOL 10 MG/ML IV BOLUS
INTRAVENOUS | Status: AC
  Filled 2022-07-15: qty 20

## 2022-07-15 MED ORDER — ACETAMINOPHEN 160 MG/5ML PO SOLN: 325.0000 mg | ORAL | Status: DC | PRN

## 2022-07-15 MED ORDER — OXYBUTYNIN CHLORIDE 5 MG PO TABS: 5.0000 mg | ORAL_TABLET | Freq: Three times a day (TID) | ORAL | 1 refills | Status: AC | PRN

## 2022-07-15 MED ORDER — LIDOCAINE 2% (20 MG/ML) 5 ML SYRINGE
INTRAMUSCULAR | Status: DC | PRN
  Administered 2022-07-15: 60 mg via INTRAVENOUS

## 2022-07-15 MED ORDER — ONDANSETRON HCL 4 MG/2ML IJ SOLN
INTRAMUSCULAR | Status: AC
  Filled 2022-07-15: qty 2

## 2022-07-15 MED ORDER — FENTANYL CITRATE PF 50 MCG/ML IJ SOSY
25.0000 ug | PREFILLED_SYRINGE | INTRAMUSCULAR | Status: DC | PRN
  Administered 2022-07-15 (×2): 50 ug via INTRAVENOUS

## 2022-07-15 MED ORDER — ONDANSETRON HCL 4 MG/2ML IJ SOLN
INTRAMUSCULAR | Status: DC | PRN
  Administered 2022-07-15: 4 mg via INTRAVENOUS

## 2022-07-15 MED ORDER — SODIUM CHLORIDE 0.9 % IR SOLN
Status: DC | PRN
  Administered 2022-07-15: 6000 mL

## 2022-07-15 MED ORDER — ORAL CARE MOUTH RINSE: 15.0000 mL | Freq: Once | OROMUCOSAL | Status: AC

## 2022-07-15 MED ORDER — FENTANYL CITRATE PF 50 MCG/ML IJ SOSY
PREFILLED_SYRINGE | INTRAMUSCULAR | Status: AC
  Filled 2022-07-15: qty 2

## 2022-07-15 MED ORDER — DEXAMETHASONE SODIUM PHOSPHATE 10 MG/ML IJ SOLN
INTRAMUSCULAR | Status: DC | PRN
  Administered 2022-07-15: 4 mg via INTRAVENOUS

## 2022-07-15 MED ORDER — FENTANYL CITRATE (PF) 100 MCG/2ML IJ SOLN
INTRAMUSCULAR | Status: DC | PRN
  Administered 2022-07-15 (×2): 50 ug via INTRAVENOUS

## 2022-07-15 MED ORDER — OXYCODONE HCL 5 MG PO TABS
ORAL_TABLET | ORAL | Status: AC
  Filled 2022-07-15: qty 1

## 2022-07-15 MED ORDER — FENTANYL CITRATE (PF) 100 MCG/2ML IJ SOLN
INTRAMUSCULAR | Status: AC
  Filled 2022-07-15: qty 2

## 2022-07-15 MED ORDER — CHLORHEXIDINE GLUCONATE 0.12 % MT SOLN
15.0000 mL | Freq: Once | OROMUCOSAL | Status: AC
  Administered 2022-07-15: 15 mL via OROMUCOSAL

## 2022-07-15 MED ORDER — CIPROFLOXACIN IN D5W 400 MG/200ML IV SOLN
400.0000 mg | INTRAVENOUS | Status: AC
  Administered 2022-07-15: 400 mg via INTRAVENOUS
  Filled 2022-07-15: qty 200

## 2022-07-15 SURGICAL SUPPLY — 20 items
BAG DRN LRG CPC RND TRDRP CNTR (MISCELLANEOUS) ×1
BAG URO CATCHER STRL LF (MISCELLANEOUS) ×1 IMPLANT
BAG URO DRAIN 4000ML (MISCELLANEOUS) IMPLANT
CATH FOLEY 2WAY SLVR  5CC 18FR (CATHETERS) ×1
CATH FOLEY 2WAY SLVR 5CC 18FR (CATHETERS) IMPLANT
CLOTH BEACON ORANGE TIMEOUT ST (SAFETY) ×1 IMPLANT
GLOVE SURG LX STRL 7.5 STRW (GLOVE) ×1 IMPLANT
GOWN STRL REUS W/ TWL XL LVL3 (GOWN DISPOSABLE) ×1 IMPLANT
GOWN STRL REUS W/TWL XL LVL3 (GOWN DISPOSABLE) ×1
GUIDEWIRE ANG ZIPWIRE 038X150 (WIRE) IMPLANT
KIT TURNOVER KIT A (KITS) IMPLANT
LASER FIB FLEXIVA PULSE ID 550 (Laser) IMPLANT
LASER FIB FLEXIVA PULSE ID 910 (Laser) IMPLANT
MANIFOLD NEPTUNE II (INSTRUMENTS) ×1 IMPLANT
PACK CYSTO (CUSTOM PROCEDURE TRAY) ×1 IMPLANT
SET AMPLATZ RENAL DILATOR (MISCELLANEOUS) IMPLANT
SYR TOOMEY IRRIG 70ML (MISCELLANEOUS) ×1
SYRINGE TOOMEY IRRIG 70ML (MISCELLANEOUS) IMPLANT
TUBING CONNECTING 10 (TUBING) IMPLANT
TUBING UROLOGY SET (TUBING) ×1 IMPLANT

## 2022-07-15 NOTE — Anesthesia Postprocedure Evaluation (Signed)
Anesthesia Post Note  Patient: Eric Crosby  Procedure(s) Performed: CYSTOSCOPY WITH LITHOLAPAXY AND URETHRAL DILATION     Patient location during evaluation: PACU Anesthesia Type: General Level of consciousness: awake and alert Pain management: pain level controlled Vital Signs Assessment: post-procedure vital signs reviewed and stable Respiratory status: spontaneous breathing, nonlabored ventilation, respiratory function stable and patient connected to nasal cannula oxygen Cardiovascular status: blood pressure returned to baseline and stable Postop Assessment: no apparent nausea or vomiting Anesthetic complications: no   No notable events documented.  Last Vitals:  Vitals:   07/15/22 1115 07/15/22 1129  BP: (!) 147/82 (!) 149/96  Pulse: 77 77  Resp: 13 16  Temp:  36.4 C  SpO2: 93% 95%    Last Pain:  Vitals:   07/15/22 1129  TempSrc: (P) Oral  PainSc: 6                  Cristo Ausburn

## 2022-07-15 NOTE — Progress Notes (Signed)
Called spouse x2.  Left VM with update.

## 2022-07-15 NOTE — Op Note (Signed)
Operative Note  Preoperative diagnosis:  1.  Membranous urethral stricture  Postoperative diagnosis: 1.  2 cm dense membranous urethral stricture  Procedure(s): 1.  Cystoscopy with urethral dilation 2.  Cystolitholapaxy of 2 cm bladder stone  Surgeon: Ellison Hughs, MD  Assistants:  None  Anesthesia:  General  Complications:  None  EBL: Less 5 mL  Specimens: 1.  Bladder stones  Drains/Catheters: 1.  18 French two-way Foley catheter with 10 mL of sterile water in the balloon  Intraoperative findings:   Dense 2 cm membranous urethral stricture 2 cm bladder stone Moderate bladder trabeculation with widemouth bladder diverticulum involving the posterior bladder wall  Indication:  Eric Crosby is a 77 y.o. male with a history of Gleason 6 prostate cancer, s/p brachytherapy in 2010.  Over the past several weeks, the patient developed progressively worsening lower urinary tract symptoms.  During office cystoscopy, he was found to have a dense urethral stricture that was dilated enough to allow a 12 French catheter to be placed.  He was also found to have multiple bladder stones during that encounter.  He is here today for urethral dilation and cystolitholapaxy.  He has been consented for the above procedures, voices understanding and wishes to proceed.  Description of procedure:  After informed consent was obtained, the patient was brought to the operating room and general LMA anesthesia was administered. The patient was then placed in the dorsolithotomy position and prepped and draped in the usual sterile fashion. A timeout was performed. A 23 French rigid cystoscope was then inserted into the urethral meatus and advanced until his dense membranous urethral stricture was encountered.  A Glidewire was then advanced through the aperture of the stricture and into the bladder.  Amplatz dilators were then used to dilate the stricture starting at 65 French and progressing up to 28  [place], and 2 [place] increments.  The cystoscope was then advanced into the bladder where 2 cm bladder stone was identified.  A 77 m holmium laser was then used to fracture the stone into numerous smaller pieces that were then siphon out of the bladder through the sheath of the cystoscope.  An 49 French Foley catheter was then placed and set to gravity drainage.  The patient tolerated the procedure well and was transferred to the postanesthesia in stable condition.  Plan:  Follow-up on 07/22/22 for catheter removal and CIC tutorial

## 2022-07-15 NOTE — Transfer of Care (Signed)
Immediate Anesthesia Transfer of Care Note  Patient: Eric Crosby  Procedure(s) Performed: CYSTOSCOPY WITH LITHOLAPAXY AND URETHRAL DILATION  Patient Location: PACU  Anesthesia Type:General  Level of Consciousness: awake and patient cooperative  Airway & Oxygen Therapy: Patient Spontanous Breathing and Patient connected to face mask oxygen  Post-op Assessment: Report given to RN and Post -op Vital signs reviewed and stable  Post vital signs: Reviewed and stable  Last Vitals:  Vitals Value Taken Time  BP 152/103 07/15/22 1010  Temp    Pulse 91 07/15/22 1012  Resp 15 07/15/22 1012  SpO2 99 % 07/15/22 1012  Vitals shown include unvalidated device data.  Last Pain:  Vitals:   07/15/22 0721  TempSrc: Oral         Complications: No notable events documented.

## 2022-07-15 NOTE — H&P (Signed)
PRE-OP H&P  Office Visit Report     06/30/2022   --------------------------------------------------------------------------------   Eric Crosby  MRN: [id number]  DOB: [birth year], 77 year old Male  PRIMARY CARE:  Thayer Headings B. Quillian Quince, NP  PRIMARY CARE FAX:  [phone]  REFERRING:  Odis Luster, MD  PROVIDER:  Ellison Hughs, M.D.  LOCATION:  [facility] [phone]     --------------------------------------------------------------------------------   CC/HPI: CC: Prostate cancer   HPI: Eric Crosby is a 77 year old male with a history of Gleason 6 prostate cancer, s/p brachytherapy in 2010. He also has a history of bilateral renal masses that were cryoablated by Dr. Kathlene Cote in 2016 (right) and 2018 (left) with no evidence of recurrence to date.   Last PSA- <0.015 (12/2021)  Last serum creatinine- 1.0 (09/2017)   06/20/20: The patient was seen in the emergency department on 06/11/2020 due to acute left-sided flank pain. He had a CT stone study at that time that revealed a 4 mm left distal ureteral calculus associated with moderate hydronephrosis. He was also found to have interval increase of an indeterminate left renal lesion that is now measuring approximately 1 cm in greatest dimension. There was no clear evidence of RCC recurrence involving the right kidney.   Today, the patient reports that his pain has completely resolved. He denies nausea/vomiting, fever/chills, dysuria or hematuria. He states that he has not seen a stone pass and that he has been straining his urine since 06/11/2020. UA continues to show microscopic hematuria but no signs of UTI.   12/20/20: The patient is here today for a routine follow-up. CT from April demonstrated a Bosniak 2 left renal cyst with no signs of RCC recurrence. He was also found to have passed his left ureteral calculus, but was noted to have two small non-obstructing right renal calculi measuring ~3 mm. Today, he has no urinary  complaints and denies interval episodes of flank pain or stone passage.   01/02/2022: The patient is here today for routine follow-up. His PSA remains undetectable. He reports no urinary issues and denies interval UTIs, dysuria or hematuria. Surveillance CT scan from 09/21/2021 revealed no evidence of RCC recurrence. He was noted to have small, bilateral nonobstructing renal stones. No new health issues. Doing well.   06/27/22: The patient is here today for an expedited follow-up. He reports a one week history of worsening urinary urgency and a weak force of stream. He had a colonoscopy around the onset of his sxs. He also notes constipation for the past 4-5 days and states that hasn't passed gas in several days. He denies flank pain, interval stone passage or hematuria. He was unable to leave a urine specimen--bladder US >400 mL.   06/30/22: The patient presents back today with increased urinary urgency with little urine output. He started Dulcolax at his last visit and reports multiple bowel movements, but little improvement in his urinary symptoms.     ALLERGIES: Sulfa Drugs    MEDICATIONS: Tamsulosin Hcl 0.4 mg capsule 1 capsule PO Daily  Alive [employer] Oral Tablet Oral  Alprazolam 0.25 mg tablet Oral  Fiber  Gabapentin  Levothyroxine Sodium 112 mcg tablet Oral  Prostate Complete  St Johns Wort TABS 0 Oral     GU PSH: Rpr Umbil Hern; Reduc < 5 Yr - 2011 TRANSPERI NEEDLE PLACE, PROS - 2011       Ashley Notes: Eye Surgery, Umbilical Hernia Repair, Oral Surgery, Surgery Prostate Transperineal Placement Of Needles, Inguinal Hernia Repair,  Eye Surgery, Tonsillectomy, Neck Surgery, Sinus Surgery   NON-GU PSH: Eyelid Lining Surgery Remove Tonsils - 2010 Visit Complexity (formerly GPC1X) - 06/27/2022     GU PMH: Urinary Urgency - 06/27/2022 Weak Urinary Stream - 06/27/2022 History of kidney cancer - 01/02/2022, - 12/20/2020, - 2022, History of renal carcinoma, - 2017 History of prostate  cancer - 01/02/2022, - 12/20/2020, History of prostate cancer, - 2017 Renal calculus - 12/20/2020 Left renal neoplasm - 2022 Microscopic hematuria - 2022 Ureteral calculus - 2022 Nocturia - 2018 Kidney Cancer Unspec, except renal pelvis, Renal cell carcinoma, unspecified laterality - 2016 Prostate Cancer, Prostate cancer - 2015 Elevated PSA, Elevated prostate specific antigen (PSA) - 2014      PMH Notes:  2008-07-21 10:14:06 - Note: Thyroid Disorder  2008-07-21 10:14:06 - Note: Arthritis   NON-GU PMH: Constipation, unspecified - 06/27/2022 Encounter for general adult medical examination without abnormal findings, Encounter for preventive health examination - 2017 Anxiety, Anxiety - 2014 Personal history of other diseases of the nervous system and sense organs, History of sleep apnea - 2014 Personal history of other specified conditions, History of heartburn - 2014    FAMILY HISTORY: Breast Cancer - Mother, Grandmother Colon Cancer - Ashley Status Number - Runs In Family Father Deceased At Age57 ___ - Runs In Family liver cancer - Grandfather Lung Cancer - Father Mother Deceased At Age 72 from diabetic complicati - Runs In Family Thyroid Cancer - Mother   SOCIAL HISTORY: No Social History     Notes: Marital History - Currently Married, Being A Economist, Previous History Of Smoking, Occupation:, Caffeine Use   REVIEW OF SYSTEMS:    GU Review Male:   Patient denies frequent urination, hard to postpone urination, burning/ pain with urination, get up at night to urinate, leakage of urine, stream starts and stops, trouble starting your stream, have to strain to urinate , erection problems, and penile pain.  Gastrointestinal (Upper):   Patient denies nausea, vomiting, and indigestion/ heartburn.  Gastrointestinal (Lower):   Patient denies constipation and diarrhea.  Constitutional:   Patient denies fever, night sweats, weight loss, and fatigue.  Skin:   Patient denies  skin rash/ lesion and itching.  Eyes:   Patient denies blurred vision and double vision.  Ears/ Nose/ Throat:   Patient denies sore throat and sinus problems.  Hematologic/Lymphatic:   Patient denies swollen glands and easy bruising.  Cardiovascular:   Patient denies leg swelling and chest pains.  Respiratory:   Patient denies cough and shortness of breath.  Endocrine:   Patient denies excessive thirst.  Musculoskeletal:   Patient denies back pain and joint pain.  Neurological:   Patient denies headaches and dizziness.  Psychologic:   Patient denies depression and anxiety.   VITAL SIGNS: None   GU PHYSICAL EXAMINATION:    Urethral Meatus: Normal size. No lesion, no wart, no discharge, no polyp. Normal location.  Penis: Circumcised, no warts, no cracks. No dorsal Peyronie's plaques, no left corporal Peyronie's plaques, no right corporal Peyronie's plaques, no scarring, no warts. No balanitis, no meatal stenosis.   MULTI-SYSTEM PHYSICAL EXAMINATION:    Constitutional: Well-nourished. No physical deformities. Normally developed. Good grooming.  Neurologic / Psychiatric: Oriented to time, oriented to place, oriented to person. No depression, no anxiety, no agitation.     Complexity of Data:   12/26/21 06/20/20 04/26/18 04/24/17 10/02/15 05/17/14 11/08/13 05/06/13  PSA  Total PSA <0.015 ng/mL <0.015 ng/mL <0.015 ng/mL 0.022 ng/mL 0.03  0.05  0.08  0.09     PROCEDURES:         Flexible Cystoscopy - 52000  Risks, benefits, and some of the potential complications of the procedure were discussed at length with the patient including infection, bleeding, voiding discomfort, urinary retention, fever, chills, sepsis, and others. All questions were answered. Informed consent was obtained. Antibiotic prophylaxis was given. Sterile technique and intraurethral analgesia were used.  Meatus:  Normal size. Normal location. Normal condition.  Urethra:  Moderate bulbous stricture.  External Sphincter:   Normal.  Verumontanum:  Normal.  Prostate:  Non-obstructing. No hyperplasia.  Bladder Neck:  Non-obstructing.  Ureteral Orifices:  Normal location. Normal size. Normal shape. Effluxed clear urine.  Bladder:  One large stone (2 cm). No trabeculation. No tumors. Normal mucosa.      The lower urinary tract was carefully examined. The procedure was well-tolerated and without complications. Antibiotic instructions were given. Instructions were given to call the office immediately for bloody urine, difficulty urinating, urinary retention, painful or frequent urination, fever, chills, nausea, vomiting or other illness. The patient stated that he understood these instructions and would comply with them.        PVR Ultrasound - [id number]  Scanned Volume: [id number] cc        Complicated Foley Catheterization - [id number]  A 12 French Foley catheter was inserted into the bladder over a glide wire using sterile technique. The patient was taught routine catheter care.         Visit Complexity - G2211          Urinalysis w/Scope Dipstick Dipstick Cont'd Micro  Color: Yellow Bilirubin: Neg mg/dL WBC/hpf: 0 - 5/hpf  Appearance: Clear Ketones: Neg mg/dL RBC/hpf: 0 - 2/hpf  Specific Gravity: 1.020 Blood: Trace ery/uL Bacteria: Rare (0-9/hpf)  pH: 5.5 Protein: Neg mg/dL Cystals: NS (Not Seen)  Glucose: Neg mg/dL Urobilinogen: 0.2 mg/dL Casts: NS (Not Seen)    Nitrites: Neg Trichomonas: Not Present    Leukocyte Esterase: Trace leu/uL Mucous: Not Present      Epithelial Cells: NS (Not Seen)      Yeast: NS (Not Seen)      Sperm: Not Present    Notes: QNS for spun micro          Morphine '4mg'$  - J2270, [id number] 4 mg was given and zero was wasted.   Qty: 4 Adm. By: Pricilla Riffle  Unit: mg Lot No [id number]  Route: IM Exp. Date 12/11/2023  Freq: None Mfgr.:   Site: Right Hip   ASSESSMENT:      ICD-10 Details  1 GU:   Bulbar urethral stricture - N35.011 Undiagnosed New Problem  2   Bladder Stone - N21.0  Undiagnosed New Problem  3   History of prostate cancer - Z85.46 Chronic, Stable   PLAN:           Schedule Return Visit/Planned Activity: Next Available Appointment - Schedule Surgery          Document Letter(s):  Created for Patient: Clinical Summary         Notes:   -Cystoscopy today revealed a dense bulbar urethral stricture that required urethral dilation starting at 6 [place] and progressing up to 79 [place]. A complete survey of his bladder revealed a 2 cm bladder stone. I then placed a 12 French catheter over a Glidewire for bladder decompression.   -I will arrange a cystolitholapaxy and urethral dilation in the coming weeks

## 2022-07-15 NOTE — Anesthesia Procedure Notes (Signed)
Procedure Name: LMA Insertion Date/Time: 07/15/2022 9:15 AM  Performed by: Eben Burow, CRNAPre-anesthesia Checklist: Patient identified, Emergency Drugs available, Suction available, Patient being monitored and Timeout performed Patient Re-evaluated:Patient Re-evaluated prior to induction Oxygen Delivery Method: Circle system utilized Preoxygenation: Pre-oxygenation with 100% oxygen Induction Type: IV induction Ventilation: Mask ventilation without difficulty LMA: LMA inserted LMA Size: 4.0 Number of attempts: 1 Tube secured with: Tape Dental Injury: Teeth and Oropharynx as per pre-operative assessment

## 2022-07-16 ENCOUNTER — Encounter (HOSPITAL_COMMUNITY): Payer: Self-pay | Admitting: Urology

## 2022-07-22 DIAGNOSIS — N35011 Post-traumatic bulbous urethral stricture: Secondary | ICD-10-CM | POA: Diagnosis not present

## 2022-07-22 DIAGNOSIS — N210 Calculus in bladder: Secondary | ICD-10-CM | POA: Diagnosis not present

## 2022-07-28 DIAGNOSIS — R3912 Poor urinary stream: Secondary | ICD-10-CM | POA: Diagnosis not present

## 2022-07-29 DIAGNOSIS — H16202 Unspecified keratoconjunctivitis, left eye: Secondary | ICD-10-CM | POA: Diagnosis not present

## 2022-08-05 DIAGNOSIS — Z86010 Personal history of colonic polyps: Secondary | ICD-10-CM | POA: Diagnosis not present

## 2022-08-05 DIAGNOSIS — K649 Unspecified hemorrhoids: Secondary | ICD-10-CM | POA: Diagnosis not present

## 2022-08-05 DIAGNOSIS — K5730 Diverticulosis of large intestine without perforation or abscess without bleeding: Secondary | ICD-10-CM | POA: Diagnosis not present

## 2022-08-12 DIAGNOSIS — H1789 Other corneal scars and opacities: Secondary | ICD-10-CM | POA: Diagnosis not present

## 2022-08-12 DIAGNOSIS — H1820 Unspecified corneal edema: Secondary | ICD-10-CM | POA: Diagnosis not present

## 2022-08-12 DIAGNOSIS — L121 Cicatricial pemphigoid: Secondary | ICD-10-CM | POA: Diagnosis not present

## 2022-08-14 DIAGNOSIS — N35011 Post-traumatic bulbous urethral stricture: Secondary | ICD-10-CM | POA: Diagnosis not present

## 2022-09-18 DIAGNOSIS — L121 Cicatricial pemphigoid: Secondary | ICD-10-CM | POA: Diagnosis not present

## 2022-09-18 DIAGNOSIS — H16423 Pannus (corneal), bilateral: Secondary | ICD-10-CM | POA: Diagnosis not present

## 2022-09-18 DIAGNOSIS — Z961 Presence of intraocular lens: Secondary | ICD-10-CM | POA: Diagnosis not present

## 2022-09-18 DIAGNOSIS — H2512 Age-related nuclear cataract, left eye: Secondary | ICD-10-CM | POA: Diagnosis not present

## 2022-09-24 DIAGNOSIS — R202 Paresthesia of skin: Secondary | ICD-10-CM | POA: Diagnosis not present

## 2022-09-24 DIAGNOSIS — E039 Hypothyroidism, unspecified: Secondary | ICD-10-CM | POA: Diagnosis not present

## 2022-09-24 DIAGNOSIS — R42 Dizziness and giddiness: Secondary | ICD-10-CM | POA: Diagnosis not present

## 2022-09-24 DIAGNOSIS — R030 Elevated blood-pressure reading, without diagnosis of hypertension: Secondary | ICD-10-CM | POA: Diagnosis not present

## 2022-09-24 DIAGNOSIS — R0602 Shortness of breath: Secondary | ICD-10-CM | POA: Diagnosis not present

## 2022-09-24 DIAGNOSIS — F411 Generalized anxiety disorder: Secondary | ICD-10-CM | POA: Diagnosis not present

## 2022-11-06 DIAGNOSIS — J018 Other acute sinusitis: Secondary | ICD-10-CM | POA: Diagnosis not present

## 2022-11-11 DIAGNOSIS — J018 Other acute sinusitis: Secondary | ICD-10-CM | POA: Diagnosis not present

## 2022-12-16 DIAGNOSIS — R339 Retention of urine, unspecified: Secondary | ICD-10-CM | POA: Diagnosis not present

## 2022-12-16 DIAGNOSIS — N35919 Unspecified urethral stricture, male, unspecified site: Secondary | ICD-10-CM | POA: Diagnosis not present

## 2023-02-06 DIAGNOSIS — F411 Generalized anxiety disorder: Secondary | ICD-10-CM | POA: Diagnosis not present

## 2023-02-06 DIAGNOSIS — R202 Paresthesia of skin: Secondary | ICD-10-CM | POA: Diagnosis not present

## 2023-02-06 DIAGNOSIS — R030 Elevated blood-pressure reading, without diagnosis of hypertension: Secondary | ICD-10-CM | POA: Diagnosis not present

## 2023-02-06 DIAGNOSIS — L219 Seborrheic dermatitis, unspecified: Secondary | ICD-10-CM | POA: Diagnosis not present

## 2023-02-12 DIAGNOSIS — Z85528 Personal history of other malignant neoplasm of kidney: Secondary | ICD-10-CM | POA: Diagnosis not present

## 2023-02-12 DIAGNOSIS — N35011 Post-traumatic bulbous urethral stricture: Secondary | ICD-10-CM | POA: Diagnosis not present

## 2023-02-12 DIAGNOSIS — Z8546 Personal history of malignant neoplasm of prostate: Secondary | ICD-10-CM | POA: Diagnosis not present

## 2023-02-12 DIAGNOSIS — N200 Calculus of kidney: Secondary | ICD-10-CM | POA: Diagnosis not present

## 2023-02-24 DIAGNOSIS — R339 Retention of urine, unspecified: Secondary | ICD-10-CM | POA: Diagnosis not present

## 2023-02-24 DIAGNOSIS — N35919 Unspecified urethral stricture, male, unspecified site: Secondary | ICD-10-CM | POA: Diagnosis not present

## 2023-04-28 ENCOUNTER — Other Ambulatory Visit (HOSPITAL_COMMUNITY): Payer: Self-pay | Admitting: Interventional Radiology

## 2023-04-28 DIAGNOSIS — N2889 Other specified disorders of kidney and ureter: Secondary | ICD-10-CM

## 2023-05-29 ENCOUNTER — Ambulatory Visit (HOSPITAL_COMMUNITY)
Admission: RE | Admit: 2023-05-29 | Discharge: 2023-05-29 | Disposition: A | Discharge status: Home / Self Care | Payer: PPO | Source: Ambulatory Visit | Attending: Interventional Radiology | Admitting: Interventional Radiology

## 2023-05-29 DIAGNOSIS — N2889 Other specified disorders of kidney and ureter: Secondary | ICD-10-CM | POA: Diagnosis present

## 2023-05-29 MED ORDER — GADOBUTROL 1 MMOL/ML IV SOLN
9.0000 mL | Freq: Once | INTRAVENOUS | Status: AC | PRN
  Administered 2023-05-29: 9 mL via INTRAVENOUS

## 2023-06-03 ENCOUNTER — Other Ambulatory Visit (HOSPITAL_COMMUNITY): Payer: Self-pay | Admitting: Interventional Radiology

## 2023-06-03 DIAGNOSIS — N2889 Other specified disorders of kidney and ureter: Secondary | ICD-10-CM

## 2023-06-09 ENCOUNTER — Ambulatory Visit
Admission: RE | Admit: 2023-06-09 | Discharge: 2023-06-09 | Disposition: A | Discharge status: Home / Self Care | Payer: PPO | Source: Ambulatory Visit | Attending: Interventional Radiology | Admitting: Interventional Radiology

## 2023-06-09 DIAGNOSIS — N2889 Other specified disorders of kidney and ureter: Secondary | ICD-10-CM

## 2023-06-09 HISTORY — PX: IR RADIOLOGIST EVAL & MGMT: IMG5224

## 2023-06-09 NOTE — Progress Notes (Signed)
Chief Complaint: Patient was consulted remotely today (TeleHealth) for follow-up after prior bilateral renal ablation.   History of Present Illness: Eric Crosby is a 78 y.o. male status post cryoablation of a left renal mass on 06/06/2016 and prior cryoablation of a right renal mass on 01/27/2014. He was last seen on 12/02/2018 at which time ablation sites appeared stable and a small hyperdense cortical lesion at the tip of the lower pole of the left kidney was noted at the site of a previous small cyst which did not demonstrate enhancement and was felt to be consistent with hemorrhage into a pre-existing small cyst.  Unenhanced CT of the abdomen on 06/11/2020 demonstrated potential slight increase in size of this lesion/complex cyst.  A multiphase CT of the abdomen with and without contrast was performed in follow-up on 09/07/2020 demonstrating characteristics of a Bosniak II cyst.  A follow-up CT on 09/19/2021 was not definitive with respect to the 11 mm cortical lesion of the lower pole of the left kidney which demonstrates potential mild enhancement.  A follow-up MRI was therefore recommended and was performed on 03/12/2022.   MRI of the abdomen on 03/12/2022 demonstrated a roughly 12 mm exophytic posterior lower pole cortical lesion of the left kidney demonstrating potential mild enhancement but also some misregistration motion artifact. The lesion was felt to most likely represent a complex/hemorrhagic cyst but continued surveillance was recommended. A follow up MRI was performed on 05/29/2023. He is asymptomatic. 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 Liliane Shi. He has been doing well following that procedure.  Past Medical History:  Diagnosis Date   Anxiety    hx of   Arthritis    hx of   Asthma    seasonal chronic   Bipolar disorder (HCC)    hx of   Blood dyscrasia    POLYCYTHEMIA- NO MEDICATION OR  TREATMENT NEEDED - PT TOLD TO MAKE EVERYONE AWARE HIS RBC CT HIGH - BUT NORMAL FOR HIM   Complication of anesthesia    Difficult intubation    Mask ventilation with an oral airway, grade III view with a MAC 4 and unable to pass bougie, grade II view with a glidescope. Recommend using glidescope for future intubations   Diverticulosis    COLITIS   Headache    migraines with aura   History of kidney stones    Hx of wheezing    OCCAS WHEEZING - POSS RELATED TO ALLERGIES OR ENVIRONMENTAL ISSUES-HAVE INHALER BUT HAS RARELY NEEDED   Hypothyroidism    OCP (ocular cicatricial pemphigoid)    IN REMISSION FOR PAST 18 YRS BUT STILL EXPERIENCE EYE PAIN DAILY AND HX OF MULTIPLE SURGERIES   Prostate cancer (HCC) 2011   TX'D WITH SEED IMPLANT   Right renal mass    Sleep apnea    PT USED CPAP FOR SEVERAL YEARS BUT IT MADE HIS EYE PROBLEM WORSE AND HE CAN NOT USE THE MACHINE NOW.    Past Surgical History:  Procedure Laterality Date   colonoscopy     CYSTOSCOPY WITH LITHOLAPAXY N/A 07/15/2022   Procedure: CYSTOSCOPY WITH LITHOLAPAXY AND URETHRAL DILATION;  Surgeon: Rene Paci, MD;  Location: [facility];  Service: Urology;  Laterality: N/A;  45 MINS   EYE SURGERY     x 20   eyelid surgery     17 SURGICAL PROCEDURES BLATERAL UPPER EYE LIDS   HERNIA REPAIR     UMBILICAL  AND LOWER ABDOMINAL HERNIA REPAIR   IR GENERIC HISTORICAL  03/25/2016   IR RADIOLOGIST EVAL & MGMT 03/25/2016 Irish Lack, MD [facility]   IR GENERIC HISTORICAL  07/11/2014   IR RADIOLOGIST EVAL & MGMT 07/11/2014 Irish Lack, MD [facility]   IR GENERIC HISTORICAL  07/09/2016   IR RADIOLOGIST EVAL & MGMT 07/09/2016 Irish Lack, MD [facility]   IR RADIOLOGIST EVAL & MGMT  09/09/2016   IR RADIOLOGIST EVAL & MGMT  10/08/2017   IR RADIOLOGIST EVAL & MGMT  12/02/2018   IR RADIOLOGIST EVAL & MGMT  06/22/2020   IR RADIOLOGIST EVAL & MGMT  09/12/2020   IR RADIOLOGIST EVAL & MGMT  09/30/2021   IR  RADIOLOGIST EVAL & MGMT  03/14/2022   NECK SURGERY  08/18/2003   ACDF  C5-6 C6-7  by Dr. Venetia Maxon   RADIOLOGY WITH ANESTHESIA Left 06/06/2016   Procedure: left renal cryoablation;  Surgeon: Irish Lack, MD;  Location: [facility];  Service: Radiology;  Laterality: Left;   RIGHT CATARACT EXTRACTION WITH LENS IMPLANT     RIGHT RETINAL SURGERY FOR BUCKLE     SEED IMPLANT FOR PROSTATE CANCER     TONSILLECTOMY     WISDOM TOOTH EXTRACTION      Allergies: Adhesive [tape] and Sulfa antibiotics  Medications: Prior to Admission medications   Medication Sig Start Date End Date Taking? Authorizing Provider  acetaminophen (TYLENOL) 650 MG CR tablet Take 650 mg by mouth every 8 (eight) hours as needed for pain.    [provider]  albuterol (PROVENTIL HFA;VENTOLIN HFA) 108 (90 BASE) MCG/ACT inhaler Inhale 2 puffs into the lungs at bedtime as needed for wheezing or shortness of breath.    [provider]  ALPRAZolam Prudy Feeler) 0.25 MG tablet Take 0.125 mg by mouth at bedtime.    [provider]  ARTIFICIAL TEARS 0.1-0.3 % SOLN Place 1 drop into both eyes as needed for dry eyes.    [provider]  calcium carbonate (TUMS - DOSED IN MG ELEMENTAL CALCIUM) 500 MG chewable tablet Chew 2 tablets by mouth 2 (two) times daily as needed for indigestion or heartburn.    [provider]  CIPROFLOXACIN HCL PO Take 1 tablet by mouth in the morning and at bedtime.    [provider]  gabapentin (NEURONTIN) 300 MG capsule Take 300 mg by mouth 3 (three) times daily.    [provider]  levothyroxine (SYNTHROID) 125 MCG tablet Take 125 mcg by mouth daily before breakfast.    [provider]  loratadine (CLARITIN) 10 MG tablet Take 10 mg by mouth daily as needed for allergies.    [provider]  meclizine (ANTIVERT) 25 MG tablet Take 25 mg by mouth daily as needed for dizziness.     [provider]  Misc Natural Products (SAW PALMETTO)  CAPS Take 1 capsule by mouth daily.    [provider]  Multiple Vitamins-Minerals (ALIVE MENS ENERGY PO) Take 1 tablet by mouth daily.    [provider]  OVER THE COUNTER MEDICATION Take 1 capsule by mouth in the morning and at bedtime. Natural Cognium Complete    [provider]  oxybutynin (DITROPAN) 5 MG tablet Take 1 tablet (5 mg total) by mouth every 8 (eight) hours as needed for bladder spasms. 07/15/22   Rene Paci, MD  oxyCODONE (ROXICODONE) 5 MG immediate release tablet Take 1 tablet (5 mg total) by mouth every 4 (four) hours as needed  for severe pain. Patient not taking: Reported on 07/04/2022 06/11/20   Sabas Sous, MD  pantoprazole (PROTONIX) 40 MG tablet Take 40 mg by mouth every evening.  Patient not taking: Reported on 07/04/2022 07/29/16   [provider]  Polyethyl Glycol-Propyl Glycol (SYSTANE) 0.4-0.3 % GEL ophthalmic gel Place 1 Application into both eyes at bedtime.    [provider]  tamsulosin (FLOMAX) 0.4 MG CAPS capsule Take 1 capsule (0.4 mg total) by mouth daily. Patient taking differently: Take 0.8 mg by mouth daily. 06/11/20   Sabas Sous, MD     No family history on file.  Social History   Socioeconomic History   Marital status: Married    Spouse name: Not on file   Number of children: Not on file   Years of education: Not on file   Highest education level: Not on file  Occupational History   Not on file  Tobacco Use   Smoking status: Former    Types: Cigarettes   Smokeless tobacco: Never   Tobacco comments:    26 years ago  Vaping Use   Vaping status: Never Used  Substance and Sexual Activity   Alcohol use: No   Drug use: No   Sexual activity: Yes  Other Topics Concern   Not on file  Social History Narrative   Not on file   Social Drivers of Health   Financial Resource Strain: Not on file  Food Insecurity: Not on file  Transportation Needs: Not on file  Physical Activity: Not  on file  Stress: Not on file  Social Connections: Not on file    ECOG Status: 0 - Asymptomatic  Review of Systems  Constitutional: Negative.   Respiratory: Negative.    Cardiovascular: Negative.   Gastrointestinal: Negative.   Genitourinary: Negative.   Musculoskeletal: Negative.   Neurological: Negative.     Review of Systems: A 12 point ROS discussed and pertinent positives are indicated in the HPI above.  All other systems are negative.   Physical Exam No direct physical exam was performed (except for noted visual exam findings with Video Visits).   Vital Signs: There were no vitals taken for this visit.  Imaging: MR ABDOMEN WWO CONTRAST Result Date: 06/06/2023 CLINICAL DATA:  Follow-up indeterminate left inferior pole renal lesion, status post bilateral renal ablation EXAM: MRI ABDOMEN WITHOUT AND WITH CONTRAST TECHNIQUE: Multiplanar multisequence MR imaging of the abdomen was performed both before and after the administration of intravenous contrast. CONTRAST:  9mL GADAVIST GADOBUTROL 1 MMOL/ML IV SOLN COMPARISON:  03/12/2022 FINDINGS: Lower chest: No acute abnormality. Hepatobiliary: No solid liver abnormality is seen. No gallstones, gallbladder wall thickening, or biliary dilatation. Pancreas: Unremarkable. No pancreatic ductal dilatation or surrounding inflammatory changes. Spleen: Normal in size without significant abnormality. Adrenals/Urinary Tract: Adrenal glands are unremarkable. Unchanged ablation defects of the posterior inferior poles of the kidneys. No suspicious soft tissue or contrast enhancement (series 20, image 29, 30). Intrinsically T1 hyperintense hemorrhagic or proteinaceous cyst arising from the posterior inferior pole of the left kidney measuring 1.2 x 1.0 cm, without evidence of contrast enhancement (series 9, image 71). Manual measurements used due to misregistration of this small lesion. Simple, benign bilateral renal cortical cysts, for which no further  follow-up or characterization is required. Small calculus near the left ureteropelvic junction measuring 0.4 cm. No hydronephrosis (series 3, image 20). Stomach/Bowel: Stomach is within normal limits. No evidence of bowel wall thickening, distention, or inflammatory changes. Vascular/Lymphatic: Aortic atherosclerosis. No enlarged abdominal  lymph nodes. Other: No abdominal wall hernia or abnormality. No ascites. Musculoskeletal: No acute or significant osseous findings. IMPRESSION: 1. Unchanged ablation defects of the posterior inferior poles of the kidneys. No suspicious soft tissue or contrast enhancement to suggest residual or recurrent disease. 2. Intrinsically T1 hyperintense hemorrhagic or proteinaceous cyst arising from the posterior inferior pole of the left kidney measuring 1.2 x 1.0 cm, without evidence of contrast enhancement, consistent with a benign Bosniak category II cyst and requiring no further follow-up or characterization. 3. Small calculus near the left ureteropelvic junction measuring 0.4 cm. No hydronephrosis. Other known renal calculi not well appreciated by MR. Aortic Atherosclerosis (ICD10-I70.0). Electronically Signed   By: Jearld Lesch M.D.   On: 06/06/2023 13:14    Labs:  CBC: Recent Labs    07/08/22 1100  WBC 7.8  HGB 15.7  HCT 47.5  PLT 345    COAGS: No results for input(s): "INR", "APTT" in the last 8760 hours.  BMP: Recent Labs    07/08/22 1100  NA 140  K 3.8  CL 109  CO2 24  GLUCOSE 94  BUN 16  CALCIUM 10.2  CREATININE 1.05  GFRNONAA >60    Assessment and Plan:  I spoke with Eric Crosby over the phone. The follow up MRI from 1/17 demonstrates stable chronic ablation defects with no evidence of tumor recurrence. The small exophytic cystic lesion of the inferior left kidney shows stable size of 1 x 1.2 cm and is consistent with a non-enhancing hemorrhagic or proteinaceous cyst by current MRI. This study is more definitive with less misregistration  compared to the 2023 study, and I told Eric Crosby I feel confident that this lesion is benign (Bosniak II) and does not require further surveillance. He will continue routine urologic follow up with Dr. Liliane Shi.   Electronically Signed: Reola Calkins 06/09/2023, 2:56 PM    I spent a total of  10 Minutes in remote  clinical consultation, greater than 50% of which was counseling/coordinating care post prior bilateral renal tumor ablation.    Visit type: Audio only (telephone). Audio (no video) only due to patient's lack of internet/smartphone capability. Alternative for in-person consultation at [facility], [address] [place], [place], [place]. This visit type was conducted due to national recommendations for restrictions regarding the COVID-19 Pandemic (e.g. social distancing).  This format is felt to be most appropriate for this patient at this time.  All issues noted in this document were discussed and addressed.

## 2023-12-22 ENCOUNTER — Ambulatory Visit: Admitting: Neurology

## 2023-12-22 ENCOUNTER — Encounter: Payer: Self-pay | Admitting: Neurology

## 2023-12-22 VITALS — BP 128/68 | Ht 74.0 in | Wt 206.0 lb

## 2023-12-22 DIAGNOSIS — H532 Diplopia: Secondary | ICD-10-CM | POA: Diagnosis not present

## 2023-12-22 NOTE — Progress Notes (Signed)
 Chief Complaint  Patient presents with   New Patient (Initial Visit)    Rm 15, NP, alone, double vision   ASSESSMENT AND PLAN  Eric Crosby is a 78 y.o. male   Transient binocular double vision  Long history of eye issues due to corneal abrasion  MRI of orbit with and without contrast to rule out structural abnormality  Acetylcholine receptor antibody to rule out neuromuscular junctional disorder, also thyroid  functional test  His vision has much improved with Prisma, under ophthalmologist care, if above test showed no significant abnormality, we will continue his care with ophthalmologist, he does not has other muscle group weakness  DIAGNOSTIC DATA (LABS, IMAGING, TESTING) - I reviewed patient records, labs, notes, testing and imaging myself where available.   MEDICAL HISTORY:  Eric Crosby is a 78 year old male, seen in request by his ophthalmologist Dr. Robinson, Selinda, for evaluation of double vision, his primary care is [facility]CP [facility] Jerel Gee, NP   History is obtained from the patient and review of electronic medical records. I personally reviewed pertinent available imaging films in PACS.   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  PHYSICAL EXAMNIATION:  Gen: NAD, conversant, well nourised, well groomed                     Cardiovascular: Regular rate rhythm, no peripheral edema, warm, nontender. Eyes: Conjunctivae clear without exudates or hemorrhage Neck: Supple, no carotid bruits. Pulmonary: Clear to auscultation bilaterally   NEUROLOGICAL EXAM:  MENTAL STATUS: Speech/cognition: Awake, alert, oriented to history taking and casual conversation CRANIAL NERVES: CN II: Visual fields are full to confrontation. Pupils are round equal and briskly reactive to light. OD 20/25, OS 20/25-1 CN III, IV, VI: extraocular movement are normal.  Cover and uncover showed mild bilateral exophoria, red lens testing showed mild transient binocular double vision on left upper temporal gaze. CN V: Facial sensation is intact to light touch CN VII: Face is symmetric with normal eye closure, postsurgical change of bilateral upper eyelid, mild eye closure weakness, but there was no other facial muscle weakness noted CN VIII: Hearing is normal to causal conversation. CN IX, X: Phonation is normal. CN XI: Head turning and shoulder shrug are intact  MOTOR: There is no pronator drift of out-stretched arms. Muscle bulk and tone are normal. Muscle strength is normal.  REFLEXES: Reflexes are 2+ and symmetric at the biceps, triceps, knees, and ankles. Plantar responses are flexor.  SENSORY: Intact to light touch, pinprick and vibratory sensation are intact in fingers and toes.  COORDINATION: There is no trunk or limb dysmetria noted.  GAIT/STANCE: Push-up, cautious  REVIEW OF SYSTEMS:  Full 14 system review of systems performed and notable only for as above All  other review of systems were negative.   ALLERGIES: Allergies  Allergen Reactions   Adhesive [Tape] Swelling and Dermatitis    Pt does not recall this reaction   Sulfa Antibiotics Other (See Comments)    Childhood reaction  Unknown      HOME MEDICATIONS: Current Outpatient Medications  Medication Sig Dispense Refill   acetaminophen  (TYLENOL ) 650 MG CR tablet Take 650 mg by mouth every 8 (eight) hours as needed for pain.     albuterol  (PROVENTIL  HFA;VENTOLIN  HFA) 108 (90 BASE) MCG/ACT inhaler Inhale 2 puffs into the lungs at bedtime as needed for wheezing or shortness of breath.     ALPRAZolam  (XANAX ) 0.25 MG tablet Take 0.125 mg by mouth at bedtime.     ARTIFICIAL TEARS 0.1-0.3 % SOLN Place 1 drop into both eyes as needed for dry eyes.     calcium  carbonate (TUMS - DOSED IN MG ELEMENTAL CALCIUM ) 500 MG chewable tablet Chew 2 tablets by mouth 2 (two) times daily as needed for indigestion or heartburn.     gabapentin (NEURONTIN) 300 MG capsule Take 300 mg by mouth 3 (three) times daily.     loratadine (CLARITIN) 10 MG tablet Take 10 mg by mouth daily as needed for allergies.     meclizine (ANTIVERT) 25 MG tablet Take 25 mg by mouth daily as needed for dizziness.      Multiple Vitamins-Minerals (ALIVE MENS ENERGY PO) Take 1 tablet by mouth daily.     OVER THE COUNTER MEDICATION Take 1 capsule by mouth in the morning and at bedtime. Natural Cognium Complete     pantoprazole  (PROTONIX ) 40 MG tablet Take 40 mg by mouth every evening.      Polyethyl Glycol-Propyl Glycol (SYSTANE) 0.4-0.3 % GEL ophthalmic gel Place 1 Application into both eyes at bedtime.     tamsulosin  (FLOMAX ) 0.4 MG CAPS capsule Take 1 capsule (0.4 mg total) by mouth daily. (Patient taking differently: Take 0.8 mg by mouth daily.) 7 capsule 0   CIPROFLOXACIN  HCL PO Take 1 tablet by mouth in the morning and at bedtime. (Patient not taking: Reported on 12/22/2023)     levothyroxine  (SYNTHROID ) 125 MCG tablet Take 125 mcg by mouth daily before breakfast. (Patient not taking: Reported on 12/22/2023)     Misc Natural Products (SAW PALMETTO) CAPS Take 1 capsule by mouth daily. (Patient not taking: Reported on 12/22/2023)     oxybutynin  (DITROPAN ) 5 MG tablet Take 1  tablet (5 mg total) by mouth every 8 (eight) hours as needed for bladder spasms. 30 tablet 1   oxyCODONE  (ROXICODONE ) 5 MG immediate release tablet Take 1 tablet (5 mg total) by mouth every 4 (four) hours as needed for severe pain. (Patient not taking: Reported on 12/22/2023) 8 tablet 0   No current facility-administered medications for this visit.    PAST MEDICAL HISTORY: Past Medical History:  Diagnosis Date   Anxiety    hx of   Arthritis    hx of   Asthma    seasonal chronic   Bipolar disorder (HCC)    hx of   Blood dyscrasia    POLYCYTHEMIA- NO MEDICATION OR TREATMENT NEEDED - PT TOLD TO MAKE EVERYONE AWARE HIS RBC CT HIGH - BUT NORMAL FOR HIM   Complication of anesthesia    Difficult intubation    Mask ventilation with an oral airway, grade III view with a MAC 4 and unable to pass bougie, grade II view with a glidescope. Recommend using glidescope for future intubations  Diverticulosis    COLITIS   Headache    migraines with aura   History of kidney stones    Hx of wheezing    OCCAS WHEEZING - POSS RELATED TO ALLERGIES OR ENVIRONMENTAL ISSUES-HAVE INHALER BUT HAS RARELY NEEDED   Hypothyroidism    OCP (ocular cicatricial pemphigoid)    IN REMISSION FOR PAST 18 YRS BUT STILL EXPERIENCE EYE PAIN DAILY AND HX OF MULTIPLE SURGERIES   Prostate cancer (HCC) 2011   TX'D WITH SEED IMPLANT   Right renal mass    Sleep apnea    PT USED CPAP FOR SEVERAL YEARS BUT IT MADE HIS EYE PROBLEM WORSE AND HE CAN NOT USE THE MACHINE NOW.    PAST SURGICAL HISTORY: Past Surgical History:  Procedure Laterality Date   colonoscopy     CYSTOSCOPY WITH LITHOLAPAXY N/A 07/15/2022   Procedure: CYSTOSCOPY WITH LITHOLAPAXY AND URETHRAL DILATION;  Surgeon: Devere Lonni Righter, MD;  Location: [facility];  Service: Urology;  Laterality: N/A;  45 MINS   EYE SURGERY     x 20   eyelid surgery     17 SURGICAL PROCEDURES BLATERAL UPPER EYE LIDS   HERNIA REPAIR     UMBILICAL  AND LOWER ABDOMINAL HERNIA  REPAIR   IR GENERIC HISTORICAL  03/25/2016   IR RADIOLOGIST EVAL & MGMT 03/25/2016 Marcey Moan, MD [facility]   IR GENERIC HISTORICAL  07/11/2014   IR RADIOLOGIST EVAL & MGMT 07/11/2014 Marcey Moan, MD [facility]   IR GENERIC HISTORICAL  07/09/2016   IR RADIOLOGIST EVAL & MGMT 07/09/2016 Marcey Moan, MD [facility]   IR RADIOLOGIST EVAL & MGMT  09/09/2016   IR RADIOLOGIST EVAL & MGMT  10/08/2017   IR RADIOLOGIST EVAL & MGMT  12/02/2018   IR RADIOLOGIST EVAL & MGMT  06/22/2020   IR RADIOLOGIST EVAL & MGMT  09/12/2020   IR RADIOLOGIST EVAL & MGMT  09/30/2021   IR RADIOLOGIST EVAL & MGMT  03/14/2022   IR RADIOLOGIST EVAL & MGMT  06/09/2023   NECK SURGERY  08/18/2003   ACDF  C5-6 C6-7  by Dr. Unice   RADIOLOGY WITH ANESTHESIA Left 06/06/2016   Procedure: left renal cryoablation;  Surgeon: Marcey Moan, MD;  Location: [facility];  Service: Radiology;  Laterality: Left;   RIGHT CATARACT EXTRACTION WITH LENS IMPLANT     RIGHT RETINAL SURGERY FOR BUCKLE     SEED IMPLANT FOR PROSTATE CANCER     TONSILLECTOMY     WISDOM TOOTH EXTRACTION      FAMILY HISTORY: History reviewed. No pertinent family history.  SOCIAL HISTORY: Social History   Socioeconomic History   Marital status: Married    Spouse name: Not on file   Number of children: Not on file   Years of education: Not on file   Highest education level: Not on file  Occupational History   Not on file  Tobacco Use   Smoking status: Former    Types: Cigarettes   Smokeless tobacco: Never   Tobacco comments:    26 years ago  Vaping Use   Vaping status: Never Used  Substance and Sexual Activity   Alcohol  use: No   Drug use: No   Sexual activity: Yes  Other Topics Concern   Not on file  Social History Narrative   Left handed   Caffeine-2 cokes daily   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 Ph.D.  [facility] [address], [address] [place], [place] [address] Ph: [phone] Fax: [phone]  CC:  Robinson Mayo, OD [address] [place],  [place] [address]  Jerel Gee, NP

## 2023-12-23 ENCOUNTER — Telehealth: Payer: Self-pay | Admitting: Neurology

## 2023-12-23 NOTE — Telephone Encounter (Signed)
 no auth required sent to [facility] [phone]

## 2024-01-09 LAB — ACHR ALL WITH REFLEX TO MUSK
AChR Binding Ab, Serum: <0.07 nmol/L (ref 0.00–0.24)
AChR-modulating Ab: 0 % (ref 0–45)
Acetylchol Block Ab: 20 % (ref 0–25)

## 2024-01-09 LAB — COMPREHENSIVE METABOLIC PANEL WITH GFR
ALT: 14 IU/L (ref 0–44)
AST: 23 IU/L (ref 0–40)
Albumin: 4.2 g/dL (ref 3.8–4.8)
Alkaline Phosphatase: 113 IU/L (ref 44–121)
BUN/Creatinine Ratio: 10 (ref 10–24)
BUN: 10 mg/dL (ref 8–27)
Bilirubin Total: 0.5 mg/dL (ref 0.0–1.2)
CO2: 20 mmol/L (ref 20–29)
Calcium: 10.6 mg/dL — ABNORMAL HIGH (ref 8.6–10.2)
Chloride: 107 mmol/L — ABNORMAL HIGH (ref 96–106)
Creatinine, Ser: 0.98 mg/dL (ref 0.76–1.27)
Globulin, Total: 2.4 g/dL (ref 1.5–4.5)
Glucose: 95 mg/dL (ref 70–99)
Potassium: 3.9 mmol/L (ref 3.5–5.2)
Sodium: 143 mmol/L (ref 134–144)
Total Protein: 6.6 g/dL (ref 6.0–8.5)
eGFR: 79 mL/min/1.73 (ref >59)

## 2024-01-09 LAB — THYROID PANEL WITH TSH
Free Thyroxine Index: 1.7 (ref 1.2–4.9)
T3 Uptake Ratio: 26 % (ref 24–39)
T4, Total: 6.5 ug/dL (ref 4.5–12.0)
TSH: 8.07 u[IU]/mL — ABNORMAL HIGH (ref 0.450–4.500)

## 2024-01-09 LAB — MUSK ANTIBODIES: MuSK Antibodies: <1 U/mL

## 2024-01-12 ENCOUNTER — Ambulatory Visit: Payer: Self-pay | Admitting: Neurology

## 2024-01-16 ENCOUNTER — Ambulatory Visit
Admission: RE | Admit: 2024-01-16 | Discharge: 2024-01-16 | Disposition: A | Discharge status: Home / Self Care | Source: Ambulatory Visit | Attending: Neurology | Admitting: Neurology

## 2024-01-16 DIAGNOSIS — H532 Diplopia: Secondary | ICD-10-CM

## 2024-01-16 MED ORDER — GADOPICLENOL 0.5 MMOL/ML IV SOLN
10.0000 mL | Freq: Once | INTRAVENOUS | Status: AC | PRN
  Administered 2024-01-16: 10 mL via INTRAVENOUS

## 2024-01-21 NOTE — Telephone Encounter (Signed)
 Pt called to  request call back from MD to  explain Results if  possible and wanted to Know if Result will be sent to Eye Doctor  Dr. Selinda Reusing

## 2024-02-11 ENCOUNTER — Telehealth: Payer: Self-pay | Admitting: Neurology

## 2024-02-11 NOTE — Telephone Encounter (Signed)
 I called [facility] about another patient. Reporting urgently trying to get in touch with Dr. Onita about needing MRI Orbit report for this patient. Can we fax report over for review? Thanks    Fax # [phone]

## 2024-02-15 NOTE — Telephone Encounter (Signed)
 Noted
